# Patient Record
Sex: Male | Born: 1995 | Hispanic: Yes | Marital: Single | Smoking: Never smoker
Health system: Southern US, Community
[De-identification: ages and names within clinical notes are randomized; demographics above are authoritative.]

## PROBLEM LIST (undated history)

## (undated) DIAGNOSIS — M25312 Other instability, left shoulder: Secondary | ICD-10-CM

## (undated) HISTORY — PX: NO PAST SURGERIES: SHX2092

---

## 2016-06-16 ENCOUNTER — Ambulatory Visit (INDEPENDENT_AMBULATORY_CARE_PROVIDER_SITE_OTHER): Payer: Self-pay | Admitting: Family Medicine

## 2016-06-16 VITALS — BP 120/84 | HR 65 | Temp 98.6°F | Resp 18 | Ht 64.0 in | Wt 131.0 lb

## 2016-06-16 DIAGNOSIS — R059 Cough, unspecified: Secondary | ICD-10-CM

## 2016-06-16 DIAGNOSIS — J029 Acute pharyngitis, unspecified: Secondary | ICD-10-CM

## 2016-06-16 DIAGNOSIS — R05 Cough: Secondary | ICD-10-CM

## 2016-06-16 LAB — POCT RAPID STREP A (OFFICE): RAPID STREP A SCREEN: NEGATIVE

## 2016-06-16 MED ORDER — BENZONATATE 100 MG PO CAPS: 100.0000 mg | ORAL_CAPSULE | Freq: Three times a day (TID) | ORAL | 0 refills | Status: DC | PRN

## 2016-06-16 NOTE — Patient Instructions (Addendum)
Sore throat- Chloraseptic spray  Take Zyrtec 10 mg at bedtime daily until symptoms resolve.  Upper Respiratory Infection, Adult Most upper respiratory infections (URIs) are a viral infection of the air passages leading to the lungs. A URI affects the nose, throat, and upper air passages. The most common type of URI is nasopharyngitis and is typically referred to as "the common cold." URIs run their course and usually go away on their own. Most of the time, a URI does not require medical attention, but sometimes a bacterial infection in the upper airways can follow a viral infection. This is called a secondary infection. Sinus and middle ear infections are common types of secondary upper respiratory infections. Bacterial pneumonia can also complicate a URI. A URI can worsen asthma and chronic obstructive pulmonary disease (COPD). Sometimes, these complications can require emergency medical care and may be life threatening.  CAUSES Almost all URIs are caused by viruses. A virus is a type of germ and can spread from one person to another.  RISKS FACTORS You may be at risk for a URI if:   You smoke.   You have chronic heart or lung disease.  You have a weakened defense (immune) system.   You are very young or very old.   You have nasal allergies or asthma.  You work in crowded or poorly ventilated areas.  You work in health care facilities or schools. SIGNS AND SYMPTOMS  Symptoms typically develop 2-3 days after you come in contact with a cold virus. Most viral URIs last 7-10 days. However, viral URIs from the influenza virus (flu virus) can last 14-18 days and are typically more severe. Symptoms may include:   Runny or stuffy (congested) nose.   Sneezing.   Cough.   Sore throat.   Headache.   Fatigue.   Fever.   Loss of appetite.   Pain in your forehead, behind your eyes, and over your cheekbones (sinus pain).  Muscle aches.  DIAGNOSIS  Your health care  provider may diagnose a URI by:  Physical exam.  Tests to check that your symptoms are not due to another condition such as:  Strep throat.  Sinusitis.  Pneumonia.  Asthma. TREATMENT  A URI goes away on its own with time. It cannot be cured with medicines, but medicines may be prescribed or recommended to relieve symptoms. Medicines may help:  Reduce your fever.  Reduce your cough.  Relieve nasal congestion. HOME CARE INSTRUCTIONS   Take medicines only as directed by your health care provider.   Gargle warm saltwater or take cough drops to comfort your throat as directed by your health care provider.  Use a warm mist humidifier or inhale steam from a shower to increase air moisture. This may make it easier to breathe.  Drink enough fluid to keep your urine clear or pale yellow.   Eat soups and other clear broths and maintain good nutrition.   Rest as needed.   Return to work when your temperature has returned to normal or as your health care provider advises. You may need to stay home longer to avoid infecting others. You can also use a face mask and careful hand washing to prevent spread of the virus.  Increase the usage of your inhaler if you have asthma.   Do not use any tobacco products, including cigarettes, chewing tobacco, or electronic cigarettes. If you need help quitting, ask your health care provider. PREVENTION  The best way to protect yourself from getting a cold  is to practice good hygiene.   Avoid oral or hand contact with people with cold symptoms.   Wash your hands often if contact occurs.  There is no clear evidence that vitamin C, vitamin E, echinacea, or exercise reduces the chance of developing a cold. However, it is always recommended to get plenty of rest, exercise, and practice good nutrition.  SEEK MEDICAL CARE IF:   You are getting worse rather than better.   Your symptoms are not controlled by medicine.   You have chills.  You  have worsening shortness of breath.  You have brown or red mucus.  You have yellow or brown nasal discharge.  You have pain in your face, especially when you bend forward.  You have a fever.  You have swollen neck glands.  You have pain while swallowing.  You have white areas in the back of your throat. SEEK IMMEDIATE MEDICAL CARE IF:   You have severe or persistent:  Headache.  Ear pain.  Sinus pain.  Chest pain.  You have chronic lung disease and any of the following:  Wheezing.  Prolonged cough.  Coughing up blood.  A change in your usual mucus.  You have a stiff neck.  You have changes in your:  Vision.  Hearing.  Thinking.  Mood. MAKE SURE YOU:   Understand these instructions.  Will watch your condition.  Will get help right away if you are not doing well or get worse.   This information is not intended to replace advice given to you by your health care provider. Make sure you discuss any questions you have with your health care provider.   Document Released: 05/05/2001 Document Revised: 03/26/2015 Document Reviewed: 02/14/2014 Elsevier Interactive Patient Education 2016 [employer].     IF you received an x-ray today, you will receive an invoice from [facility]. Please contact [facility] at [phone] with questions or concerns regarding your invoice.   IF you received labwork today, you will receive an invoice from [employer]. Please contact [facility] at [phone] with questions or concerns regarding your invoice.   Our billing staff will not be able to assist you with questions regarding bills from these companies.  You will be contacted with the lab results as soon as they are available. The fastest way to get your results is to activate your My Chart account. Instructions are located on the last page of this paperwork. If you have not heard from [place] regarding the results in 2 weeks,  please contact this office.

## 2016-06-16 NOTE — Progress Notes (Signed)
   Patient ID: Evan Zimmerman, male    DOB: December 16, 1995, 20 y.o.   MRN: [id number]  PCP: No primary care provider on file.  Chief Complaint  Patient presents with  . Sore Throat    Since Saturday     Subjective:   HPI Presents for evaluation of sore throat times 4 days.  Patient reports developing a cough with burning in his throat 4 days ago. He reports feeling fatigued.  No body aches or known fever. He has developed a "mild headache" times one day. He has taken Ibuprofen with improved symptoms.  No recent contact with anyone who has been sick.  The patient attributes symptoms to starting a new job in very cold environment.      Review of Systems  Constitutional: Positive for fatigue. Negative for chills.  HENT: Positive for congestion, rhinorrhea and sore throat. Negative for ear discharge, ear pain and sinus pressure.   Eyes: Negative.   Respiratory: Negative.   Cardiovascular: Negative.        There are no active problems to display for this patient.    Prior to Admission medications   Not on File     Not on File     Objective:  Physical Exam  Constitutional: He appears well-developed and well-nourished.  HENT:  Head: Normocephalic and atraumatic.  Right Ear: External ear normal.  Left Ear: External ear normal.  Erythema bilateral nares. Dry nasal drainage. Throat has mild erythema present. Negative of exudate.   Eyes: Pupils are equal, round, and reactive to light.  Cardiovascular: Normal rate, regular rhythm, normal heart sounds and intact distal pulses.   Pulmonary/Chest: Effort normal and breath sounds normal.  Skin: Skin is warm and dry.           Assessment & Plan:  .1. Sore throat and Cough-Likely related to acute upper respiratory illness.   Patient has remained afebrile. Rapid Strep negative. -Treat symptomatically with over the counter chloraseptic spray . -Increase water intake.   2. Cough-Likely a symptom resulting from an acute  upper respiratory illness.  Benzonatate 100 mg, 3 times daily, as needed.   Follow-up as needed.  Godfrey Pick. Tiburcio Pea, MSN, FNP-C [facility]

## 2016-06-18 LAB — CULTURE, GROUP A STREP: Organism ID, Bacteria: NORMAL

## 2018-08-26 ENCOUNTER — Emergency Department (HOSPITAL_COMMUNITY)
Admission: EM | Admit: 2018-08-26 | Discharge: 2018-08-27 | Disposition: A | Discharge status: Home / Self Care | Payer: Self-pay | Attending: Emergency Medicine | Admitting: Emergency Medicine

## 2018-08-26 ENCOUNTER — Encounter (HOSPITAL_COMMUNITY): Payer: Self-pay | Admitting: Nurse Practitioner

## 2018-08-26 ENCOUNTER — Emergency Department (HOSPITAL_COMMUNITY): Payer: Self-pay

## 2018-08-26 DIAGNOSIS — Y92322 Soccer field as the place of occurrence of the external cause: Secondary | ICD-10-CM | POA: Insufficient documentation

## 2018-08-26 DIAGNOSIS — W1830XA Fall on same level, unspecified, initial encounter: Secondary | ICD-10-CM | POA: Insufficient documentation

## 2018-08-26 DIAGNOSIS — Y999 Unspecified external cause status: Secondary | ICD-10-CM | POA: Insufficient documentation

## 2018-08-26 DIAGNOSIS — Y9366 Activity, soccer: Secondary | ICD-10-CM | POA: Insufficient documentation

## 2018-08-26 DIAGNOSIS — S43015A Anterior dislocation of left humerus, initial encounter: Secondary | ICD-10-CM | POA: Insufficient documentation

## 2018-08-26 MED ORDER — PROPOFOL 10 MG/ML IV BOLUS
0.5000 mg/kg | Freq: Once | INTRAVENOUS | Status: AC
  Administered 2018-08-27: 75 mg via INTRAVENOUS
  Filled 2018-08-26: qty 20

## 2018-08-26 NOTE — ED Provider Notes (Signed)
[facility]-EMERGENCY DEPT Provider Note   CSN: [id number] Arrival date & time: 08/26/18  2159     History   Chief Complaint Chief Complaint  Patient presents with  . Shoulder Pain    Left    HPI Evan Zimmerman is a 22 y.o. male.  The history is provided by the patient.  He states that he was playing soccer and he fell and injured his left shoulder.  He states in the past that it is felt like it is popped out and popped back in, but this time it did not pop back in.  He denies other injury.  History reviewed. No pertinent past medical history.  There are no active problems to display for this patient.   History reviewed. No pertinent surgical history.      Home Medications    Prior to Admission medications   Medication Sig Start Date End Date Taking? Authorizing Provider  benzonatate (TESSALON) 100 MG capsule Take 1 capsule (100 mg total) by mouth 3 (three) times daily as needed for cough. 06/16/16   Bing Neighbors, FNP    Family History Family History  Problem Relation Age of Onset  . Heart disease Mother   . Hyperlipidemia Mother     Social History Social History   Tobacco Use  . Smoking status: Never Smoker  . Smokeless tobacco: Never Used  Substance Use Topics  . Alcohol use: Yes  . Drug use: Never     Allergies   Patient has no known allergies.   Review of Systems Review of Systems  All other systems reviewed and are negative.    Physical Exam Updated Vital Signs BP 119/72 (BP Location: Right Arm)   Pulse 76   Temp 98.5 F (36.9 C) (Oral)   Resp 16   Ht 5\' 4"  (1.626 m)   Wt 65.8 kg   SpO2 98%   BMI 24.89 kg/m   Physical Exam  Nursing note and vitals reviewed.  22 year old male, resting comfortably and in no acute distress. Vital signs are normal. Oxygen saturation is 98%, which is normal. Head is normocephalic and atraumatic. PERRLA, EOMI. Oropharynx is clear. Neck is nontender and supple without  adenopathy or JVD. Back is nontender and there is no CVA tenderness. Lungs are clear without rales, wheezes, or rhonchi. Chest is nontender. Heart has regular rate and rhythm without murmur. Abdomen is soft, flat, nontender without masses or hepatosplenomegaly and peristalsis is normoactive. Extremities: Deformity of the left shoulder noted consistent with anterior dislocation.  There is pain in any passive movement.  Distal neurovascular exam is intact with strong pulses, prompt capillary refill, normal sensation. Skin is warm and dry without rash. Neurologic: Mental status is normal, cranial nerves are intact, there are no motor or sensory deficits.  ED Treatments / Results   Radiology Dg Shoulder Left  Result Date: 08/26/2018 CLINICAL DATA:  Recent fall playing soccer, shoulder pain, history of dislocations EXAM: LEFT SHOULDER - 2+ VIEW COMPARISON:  None available FINDINGS: There is anterior inferior dislocation of the left glenohumeral joint. Limited exam because of positioning. Visualized thorax unremarkable. AC joint aligned. IMPRESSION: Anterior inferior left shoulder dislocation Electronically Signed   By: Judie Petit.  Shick M.D.   On: 08/26/2018 22:47   Dg Shoulder Left Port  Result Date: 08/27/2018 CLINICAL DATA:  Post reduction of left shoulder dislocation. EXAM: LEFT SHOULDER - 1 VIEW COMPARISON:  None. FINDINGS: The previous anterior dislocation has been reduced. IMPRESSION: The previous anterior dislocation  has been reduced. Electronically Signed   By: Gerome Sam III M.D   On: 08/27/2018 00:47    Procedures .Sedation Date/Time: 08/27/2018 12:10 AM Performed by: Dione Booze, MD Authorized by: Dione Booze, MD   Consent:    Consent obtained:  Verbal and written   Consent given by:  Patient   Risks discussed:  Allergic reaction, dysrhythmia, inadequate sedation, nausea, prolonged hypoxia resulting in organ damage, prolonged sedation necessitating reversal, respiratory compromise  necessitating ventilatory assistance and intubation and vomiting   Alternatives discussed:  Analgesia without sedation, anxiolysis and regional anesthesia Universal protocol:    Procedure explained and questions answered to patient or proxy's satisfaction: yes     Relevant documents present and verified: yes     Test results available and properly labeled: yes     Imaging studies available: yes     Required blood products, implants, devices, and special equipment available: yes     Site/side marked: yes     Immediately prior to procedure a time out was called: yes     Patient identity confirmation method:  Verbally with patient Indications:    Procedure necessitating sedation performed by:  Physician performing sedation Pre-sedation assessment:    Time since last food or drink:  6 hours   ASA classification: class 1 - normal, healthy patient     Neck mobility: normal     Mouth opening:  3 or more finger widths   Thyromental distance:  4 finger widths   Mallampati score:  I - soft palate, uvula, fauces, pillars visible   Pre-sedation assessments completed and reviewed: airway patency, cardiovascular function, hydration status, mental status, nausea/vomiting, pain level, respiratory function and temperature   Immediate pre-procedure details:    Reassessment: Patient reassessed immediately prior to procedure     Reviewed: vital signs, relevant labs/tests and NPO status     Verified: bag valve mask available, emergency equipment available, intubation equipment available, IV patency confirmed, oxygen available and suction available   Procedure details (see MAR for exact dosages):    Preoxygenation:  Nasal cannula   Sedation:  Propofol   Intra-procedure monitoring:  Blood pressure monitoring, cardiac monitor, continuous pulse oximetry, frequent LOC assessments, frequent vital sign checks and continuous capnometry   Intra-procedure events: none     Total Provider sedation time (minutes):   30 Post-procedure details:    Post-sedation assessment completed:  08/27/2018 12:40 AM   Attendance: Constant attendance by certified staff until patient recovered     Recovery: Patient returned to pre-procedure baseline     Post-sedation assessments completed and reviewed: airway patency, cardiovascular function, hydration status, mental status, nausea/vomiting, pain level, respiratory function and temperature     Patient is stable for discharge or admission: yes     Patient tolerance:  Tolerated well, no immediate complications Reduction of dislocation Date/Time: 08/27/2018 12:10 AM Performed by: Dione Booze, MD Authorized by: Dione Booze, MD  Consent: Written consent obtained. Risks and benefits: risks, benefits and alternatives were discussed Consent given by: patient Patient understanding: patient states understanding of the procedure being performed Patient consent: the patient's understanding of the procedure matches consent given Procedure consent: procedure consent matches procedure scheduled Relevant documents: relevant documents present and verified Test results: test results available and properly labeled Site marked: the operative site was marked Imaging studies: imaging studies available Required items: required blood products, implants, devices, and special equipment available Patient identity confirmed: verbally with patient and arm band Time out: Immediately prior to procedure a "time  out" was called to verify the correct patient, procedure, equipment, support staff and site/side marked as required. Local anesthesia used: no  Anesthesia: Local anesthesia used: no  Sedation: Patient sedated: yes Sedatives: propofol Sedation start date/time: 08/27/2018 12:10 AM Sedation end date/time: 08/27/2018 12:40 AM Vitals: Vital signs were monitored during sedation.  Patient tolerance: Patient tolerated the procedure well with no immediate complications Comments: Shoulder  dislocation reduced with traction/countertraction and manipulation. Post red cution x-ray ordered.      Medications Ordered in ED Medications  propofol (DIPRIVAN) 10 mg/mL bolus/IV push 32.9 mg (75 mg Intravenous Given by Other 08/27/18 0036)  propofol (DIPRIVAN) 10 mg/mL bolus/IV push (20 mg Intravenous Given 08/27/18 0019)     Initial Impression / Assessment and Plan / ED Course  I have reviewed the triage vital signs and the nursing notes.  Pertinent imaging results that were available during my care of the patient were reviewed by me and considered in my medical decision making (see chart for details).  Anterior dislocation of left shoulder.  X-ray confirms anterior dislocation.  Patient underwent procedural sedation with propofol, and dislocation reduced.  He was placed in a sling.  Old records are reviewed, and he has no relevant past visits.  He is referred to orthopedics for follow-up, discharged with prescription for tramadol.  Final Clinical Impressions(s) / ED Diagnoses   Final diagnoses:  Closed anterior dislocation of left shoulder, initial encounter    ED Discharge Orders         Ordered    traMADol (ULTRAM) 50 MG tablet  Every 6 hours PRN     08/27/18 0100           Dione Booze, MD 08/27/18 [phone]

## 2018-08-26 NOTE — ED Triage Notes (Signed)
Pt is c/o left shoulder pain that he states he suspect may have dislocated, states happens quite often. He adds that he fell on an out streathced arm.

## 2018-08-27 ENCOUNTER — Emergency Department (HOSPITAL_COMMUNITY): Payer: Self-pay

## 2018-08-27 MED ORDER — TRAMADOL HCL 50 MG PO TABS: 50.0000 mg | ORAL_TABLET | Freq: Four times a day (QID) | ORAL | 0 refills | Status: DC | PRN

## 2018-08-27 MED ORDER — PROPOFOL 10 MG/ML IV BOLUS
INTRAVENOUS | Status: AC | PRN
  Administered 2018-08-27: 20 mg via INTRAVENOUS
  Administered 2018-08-27: 32.9 mg via INTRAVENOUS
  Administered 2018-08-27: 20 mg via INTRAVENOUS

## 2018-08-27 NOTE — Discharge Instructions (Addendum)
Wear the sling until you see the orthopedic doctor.  Apply ice for 30 minutes 3-4 times a day. Take ibuprofen or acetaminophen as needed for less severe pain.

## 2019-04-16 ENCOUNTER — Emergency Department (HOSPITAL_COMMUNITY): Payer: Self-pay

## 2019-04-16 ENCOUNTER — Other Ambulatory Visit: Payer: Self-pay

## 2019-04-16 ENCOUNTER — Emergency Department (HOSPITAL_COMMUNITY)
Admission: EM | Admit: 2019-04-16 | Discharge: 2019-04-16 | Disposition: A | Discharge status: Home / Self Care | Payer: Self-pay | Attending: Emergency Medicine | Admitting: Emergency Medicine

## 2019-04-16 ENCOUNTER — Encounter (HOSPITAL_COMMUNITY): Payer: Self-pay

## 2019-04-16 DIAGNOSIS — S43005A Unspecified dislocation of left shoulder joint, initial encounter: Secondary | ICD-10-CM | POA: Insufficient documentation

## 2019-04-16 DIAGNOSIS — Y929 Unspecified place or not applicable: Secondary | ICD-10-CM | POA: Insufficient documentation

## 2019-04-16 DIAGNOSIS — Y999 Unspecified external cause status: Secondary | ICD-10-CM | POA: Insufficient documentation

## 2019-04-16 DIAGNOSIS — Y939 Activity, unspecified: Secondary | ICD-10-CM | POA: Insufficient documentation

## 2019-04-16 DIAGNOSIS — X58XXXA Exposure to other specified factors, initial encounter: Secondary | ICD-10-CM | POA: Insufficient documentation

## 2019-04-16 MED ORDER — PROPOFOL 10 MG/ML IV BOLUS
INTRAVENOUS | Status: AC | PRN
  Administered 2019-04-16 (×2): 20 mg via INTRAVENOUS
  Administered 2019-04-16: 60 mg via INTRAVENOUS
  Administered 2019-04-16: 20 mg via INTRAVENOUS

## 2019-04-16 MED ORDER — PROPOFOL 10 MG/ML IV BOLUS
0.5000 mg/kg | Freq: Once | INTRAVENOUS | Status: DC
  Filled 2019-04-16: qty 20

## 2019-04-16 NOTE — ED Notes (Signed)
Portable X-Ray at bedside.

## 2019-04-16 NOTE — ED Notes (Signed)
Pt easily aroused, alert and oriented x 4, pt aware we are continuing to monitor status. Pt has not complaints

## 2019-04-16 NOTE — ED Notes (Signed)
Good pulse and cap refills in left ext, due to ? Left shoulder Dislocation arm held at slight angle.

## 2019-04-16 NOTE — Sedation Documentation (Signed)
He tolerates sedation procedure quite well. He maintained his airway with normal depth/rate of respirations throughout. He is now awake and comfortable. He thanks [place] for our care.

## 2019-04-16 NOTE — ED Triage Notes (Signed)
Pt states that he was playing with his dog when he injured his left shoulder. Hx of dislocation.

## 2019-04-16 NOTE — Discharge Instructions (Signed)
It was my pleasure taking care of you today!   Wear sling for the next 2-3 days.   Tylenol or Ibuprofen as needed for pain.   Call Orthopedic doctor on Tuesday to schedule follow up appointment.   Return to ER for new or worsening symptoms, any additional concerns.

## 2019-04-16 NOTE — ED Provider Notes (Signed)
.  Sedation Date/Time: 04/16/2019 1:00 PM Performed by: Tilden Fossa, MD Authorized by: Tilden Fossa, MD   Consent:    Consent obtained:  Verbal   Consent given by:  Evan Zimmerman   Risks discussed:  Allergic reaction, dysrhythmia, inadequate sedation, nausea, prolonged hypoxia resulting in organ damage, prolonged sedation necessitating reversal, respiratory compromise necessitating ventilatory assistance and intubation and vomiting   Alternatives discussed:  Analgesia without sedation, anxiolysis and regional anesthesia Universal protocol:    Procedure explained and questions answered to Evan Zimmerman or proxy's satisfaction: yes     Relevant documents present and verified: yes     Test results available and properly labeled: yes     Imaging studies available: yes     Required blood products, implants, devices, and special equipment available: yes     Site/side marked: yes     Immediately prior to procedure a time out was called: yes     Evan Zimmerman identity confirmation method:  Verbally with Evan Zimmerman Indications:    Procedure necessitating sedation performed by:  Physician performing sedation Pre-sedation assessment:    Time since last food or drink:  8   ASA classification: class 1 - normal, healthy Evan Zimmerman     Neck mobility: normal     Mouth opening:  3 or more finger widths   Thyromental distance:  4 finger widths   Mallampati score:  I - soft palate, uvula, fauces, pillars visible   Pre-sedation assessments completed and reviewed: airway patency, cardiovascular function, hydration status, mental status, nausea/vomiting, pain level, respiratory function and temperature   Immediate pre-procedure details:    Reassessment: Evan Zimmerman reassessed immediately prior to procedure     Reviewed: vital signs, relevant labs/tests and NPO status     Verified: bag valve mask available, emergency equipment available, intubation equipment available, IV patency confirmed, oxygen available and suction available    Procedure details (see MAR for exact dosages):    Preoxygenation:  Nasal cannula   Sedation:  Propofol   Intra-procedure monitoring:  Blood pressure monitoring, cardiac monitor, continuous pulse oximetry, frequent LOC assessments, frequent vital sign checks and continuous capnometry   Intra-procedure events: none     Total Provider sedation time (minutes):  11 Post-procedure details:    Post-sedation assessment completed:  04/16/2019 1:17 PM   Attendance: Constant attendance by certified staff until Evan Zimmerman recovered     Recovery: Evan Zimmerman returned to pre-procedure baseline     Post-sedation assessments completed and reviewed: airway patency, cardiovascular function, hydration status, mental status, nausea/vomiting, pain level, respiratory function and temperature     Evan Zimmerman is stable for discharge or admission: yes     Evan Zimmerman tolerance:  Tolerated well, no immediate complications      Tilden Fossa, MD 04/16/19 [phone]

## 2019-04-16 NOTE — ED Provider Notes (Signed)
Evan Zimmerman   CSN: [id number] Arrival date & time: 04/16/19  1144    History   Chief Complaint Chief Complaint  Patient presents with  . Shoulder Injury    HPI Evan Zimmerman is a 23 y.o. male.     The history is provided by the patient and medical records. No language interpreter was used.  Shoulder Injury    Evan Zimmerman is a 23 y.o. male who presents to the Emergency Department complaining of acute onset of left shoulder pain which began just prior to arrival.  Patient states that he was leaning over playing with his dog when his left shoulder suddenly popped out of place.  History of anterior shoulder dislocation in the past which felt similar.  No numbness.  No medications prior to arrival for pain.   History reviewed. No pertinent past medical history.  There are no active problems to display for this patient.   History reviewed. No pertinent surgical history.      Home Medications    Prior to Admission medications   Medication Sig Start Date End Date Taking? Authorizing Provider  traMADol (ULTRAM) 50 MG tablet Take 1 tablet (50 mg total) by mouth every 6 (six) hours as needed. 08/27/18   Dione Booze, MD    Family History Family History  Problem Relation Age of Onset  . Heart disease Mother   . Hyperlipidemia Mother     Social History Social History   Tobacco Use  . Smoking status: Never Smoker  . Smokeless tobacco: Never Used  Substance Use Topics  . Alcohol use: Yes  . Drug use: Never     Allergies   Patient has no known allergies.   Review of Systems Review of Systems  Musculoskeletal: Positive for arthralgias and myalgias.  All other systems reviewed and are negative.    Physical Exam Updated Vital Signs BP 131/66   Pulse 61   Temp 98.4 F (36.9 C) (Oral)   Resp 18   Wt 66 kg   SpO2 99%   BMI 24.98 kg/m   Physical Exam Vitals signs and nursing Zimmerman reviewed.   Constitutional:      General: He is not in acute distress.    Appearance: He is well-developed.  HENT:     Head: Normocephalic and atraumatic.  Neck:     Musculoskeletal: Neck supple.  Cardiovascular:     Rate and Rhythm: Normal rate and regular rhythm.     Heart sounds: Normal heart sounds. No murmur.  Pulmonary:     Effort: Pulmonary effort is normal. No respiratory distress.     Breath sounds: Normal breath sounds.  Musculoskeletal:     Comments: Palpable step off to the left shoulder concerning for dislocation. Decreased ROM at shoulder. Strong grip strength. 2+ radial pulse.   Skin:    General: Skin is warm and dry.  Neurological:     Mental Status: He is alert and oriented to person, place, and time.     Comments: Sensation intact to left upper extremity in axillary, median, ulnar and radial nerve distributions.      ED Treatments / Results  Labs (all labs ordered are listed, but only abnormal results are displayed) Labs Reviewed - No data to display  EKG None  Radiology Dg Shoulder Left  Result Date: 04/16/2019 CLINICAL DATA:  Post reduction for anterior shoulder dislocation EXAM: LEFT SHOULDER - 2+ VIEW COMPARISON:  Apr 16, 2019 study obtained earlier in the day  FINDINGS: Oblique and Y scapular images were obtained. There has been successful reduction of anterior dislocation. Currently no fracture or dislocation. Joint spaces appear normal. No erosive change. Visualized right lung clear. IMPRESSION: Successful reduction of anterior shoulder dislocation. Currently no fracture or dislocation. No evident arthropathy. Electronically Signed   By: Bretta BangWilliam  Woodruff III M.D.   On: 04/16/2019 13:33   Dg Shoulder Left  Result Date: 04/16/2019 CLINICAL DATA:  Left shoulder pain following injury playing with dog. History of dislocation. EXAM: LEFT SHOULDER - 2+ VIEW COMPARISON:  Radiographs 08/27/2018 and 08/26/2018. FINDINGS: AP and Y-views. Patient unable to position for the  axillary view. Recurrent anterior inferior dislocation of the glenohumeral joint without definite acute fracture. No significant arthropathic changes. 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 Postreduction views recommended. Electronically Signed   By: Carey BullocksWilliam  Veazey M.D.   On: 04/16/2019 12:26    Procedures Reduction of dislocation Date/Time: 04/16/2019 1:54 PM Performed by: Dyanara Cozza, Chase PicketJaime Pilcher, PA-C Authorized by: Celina Shiley, Chase PicketJaime Pilcher, PA-C  Consent: Verbal consent obtained. Written consent obtained. Risks and benefits: risks, benefits and alternatives were discussed Consent given by: patient Patient understanding: patient states understanding of the procedure being performed Patient consent: the patient's understanding of the procedure matches consent given Procedure consent: procedure consent matches procedure scheduled Relevant documents: relevant documents present and verified Imaging studies: imaging studies available Patient identity confirmed: verbally with patient and arm band Time out: Immediately prior to procedure a "time out" was called to verify the correct patient, procedure, equipment, support staff and site/side marked as required.  Sedation: Patient sedated: yes Sedatives: propofol Vitals: Vital signs were monitored during sedation.  Patient tolerance: Patient tolerated the procedure well with no immediate complications    (including critical care time)  Medications Ordered in ED Medications  propofol (DIPRIVAN) 10 mg/mL bolus/IV push 33 mg (0 mg Intravenous Hold 04/16/19 1238)  propofol (DIPRIVAN) 10 mg/mL bolus/IV push (20 mg Intravenous Given 04/16/19 1307)     Initial Impression / Assessment and Plan / ED Course  I have reviewed the triage vital signs and the nursing notes.  Pertinent labs & imaging results that were available during my care of the patient were reviewed by me and considered in my medical decision  making (see chart for details).       Evan Zimmerman is a 23 y.o. male who presents to ED for acute left shoulder pain. Left shoulder dislocation on exam. Confirmed on X-ray. NVI. Reduced in ED as dictated above without complication. Sedation performed by attending, Dr. Madilyn Hookees. NVI after reduction as well. Placed in sling. Ortho follow up provided. Return precautions discussed and all questions answered.   Patient seen by and discussed with Dr. Madilyn Hookees who agrees with treatment plan.   Final Clinical Impressions(s) / ED Diagnoses   Final diagnoses:  Dislocation of left shoulder joint, initial encounter    ED Discharge Orders    None       Mosie Angus, Chase PicketJaime Pilcher, PA-C 04/16/19 1406    Tilden Fossaees, Elizabeth, MD 04/17/19 [phone]

## 2019-04-16 NOTE — ED Notes (Signed)
Pt ambulated without difficulty

## 2019-08-21 IMAGING — CR DG SHOULDER 2+V*L*
2 series · 2 of 2 positions shown · non-contrast
Comparison: None available

CLINICAL DATA: Recent fall playing soccer, shoulder pain, history
of dislocations

EXAM:
LEFT SHOULDER - 2+ VIEW

[w shoulder external left]
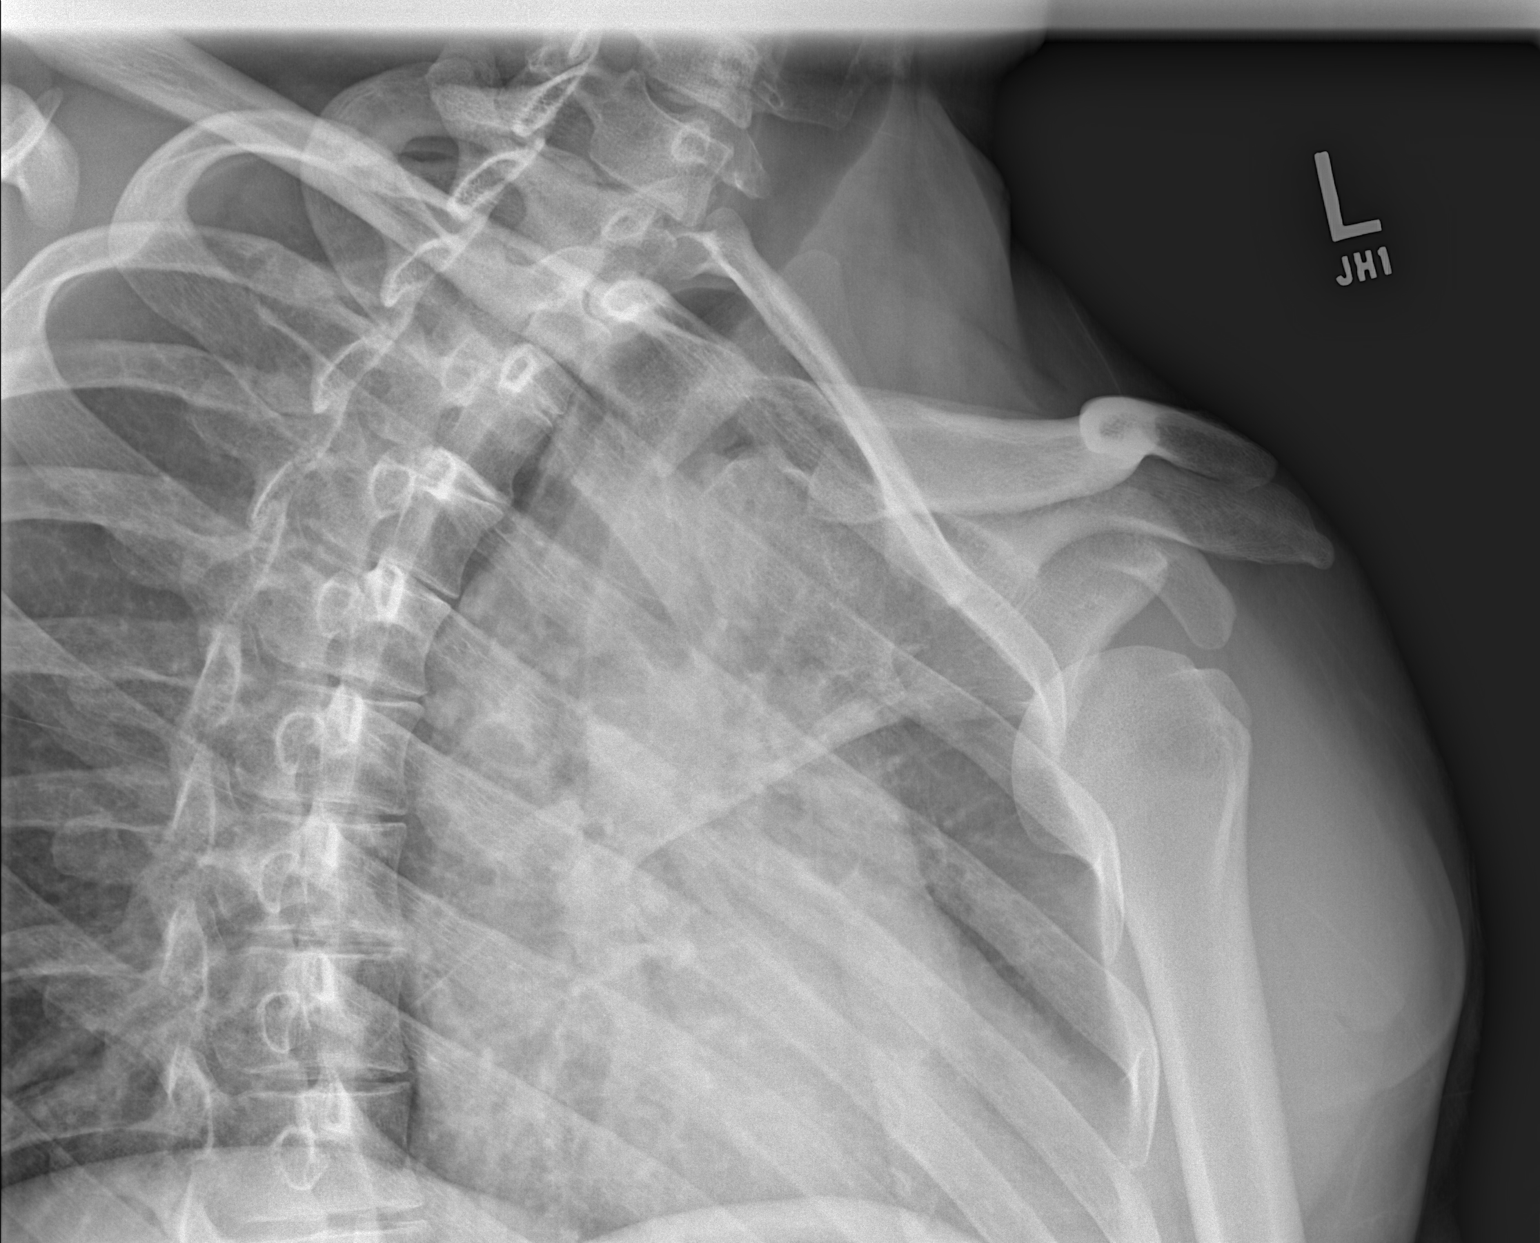

[w shoulder y-view left]
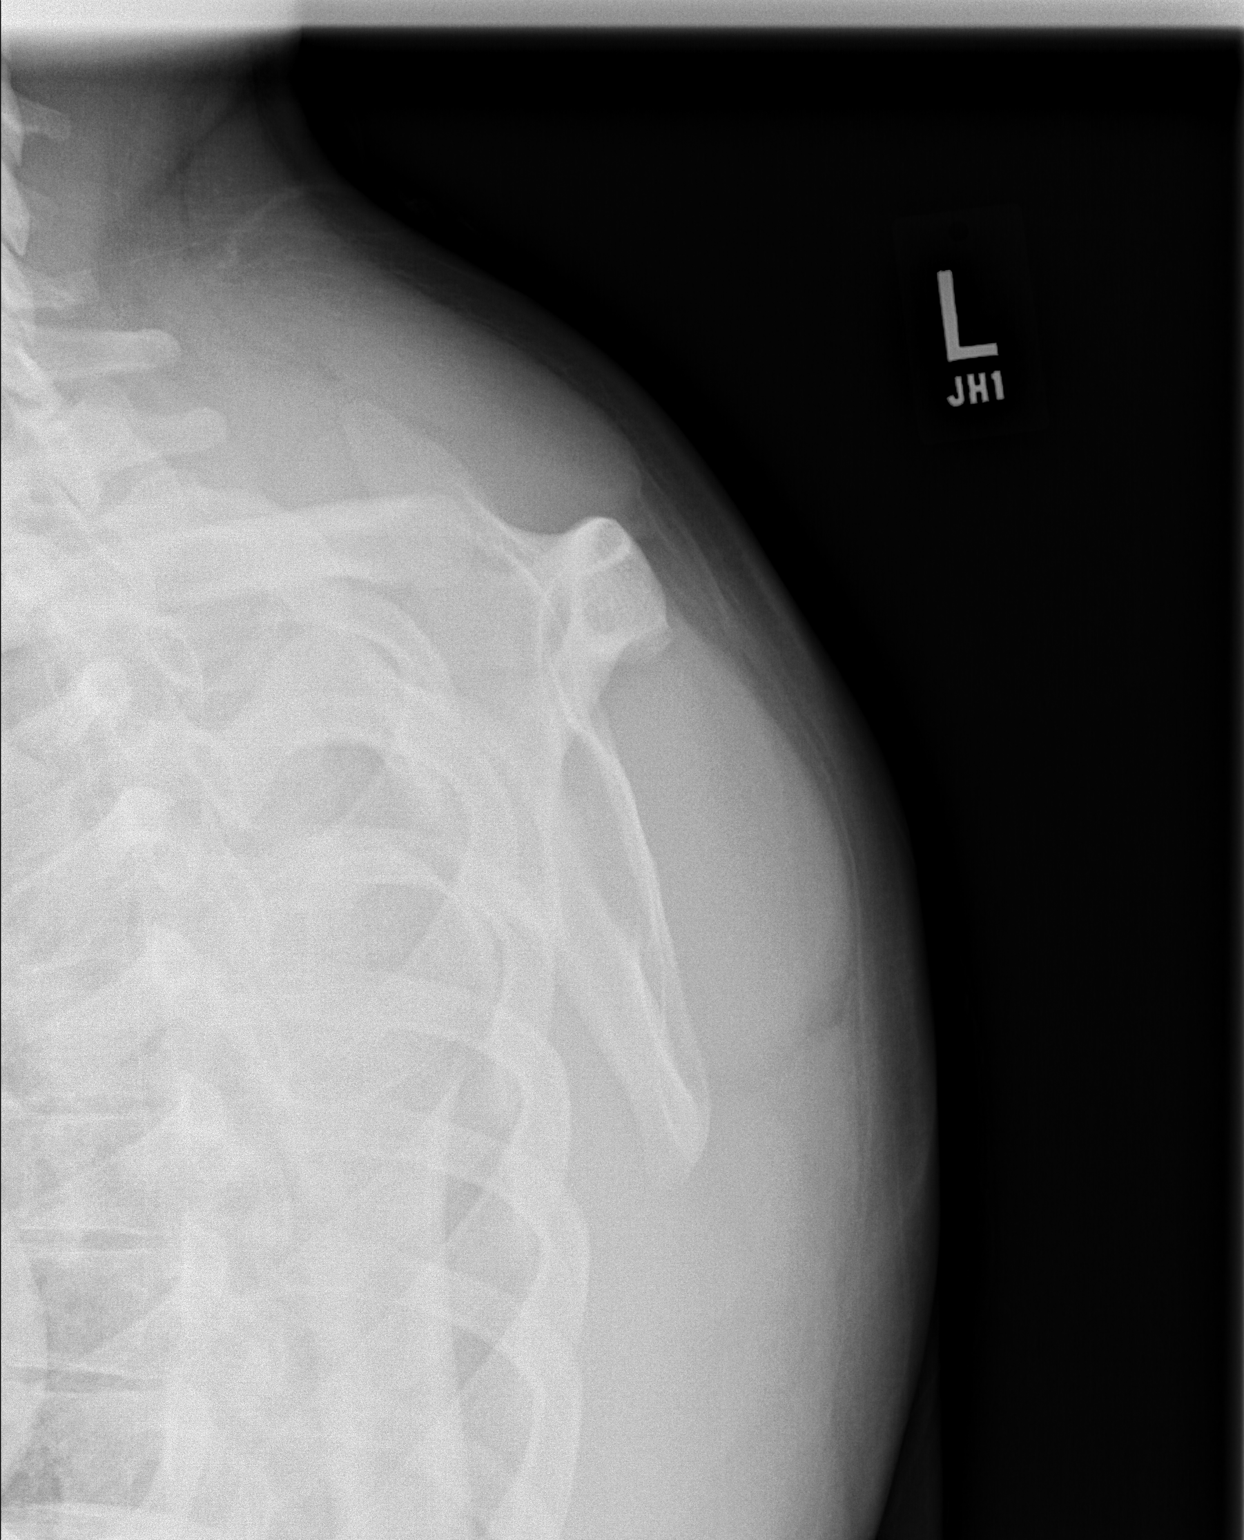

[2 of 2 positions shown; findings below may reference images not displayed]

FINDINGS: There is anterior inferior dislocation of the left glenohumeral
joint. Limited exam because of positioning. Visualized thorax
unremarkable. AC joint aligned.
IMPRESSION: Anterior inferior left shoulder dislocation

## 2019-08-22 IMAGING — DX DG SHOULDER 1V*L*
2 series · 2 of 2 positions shown · non-contrast
Comparison: None.

CLINICAL DATA: Post reduction of left shoulder dislocation.

EXAM:
LEFT SHOULDER - 1 VIEW

[shoulder ap]
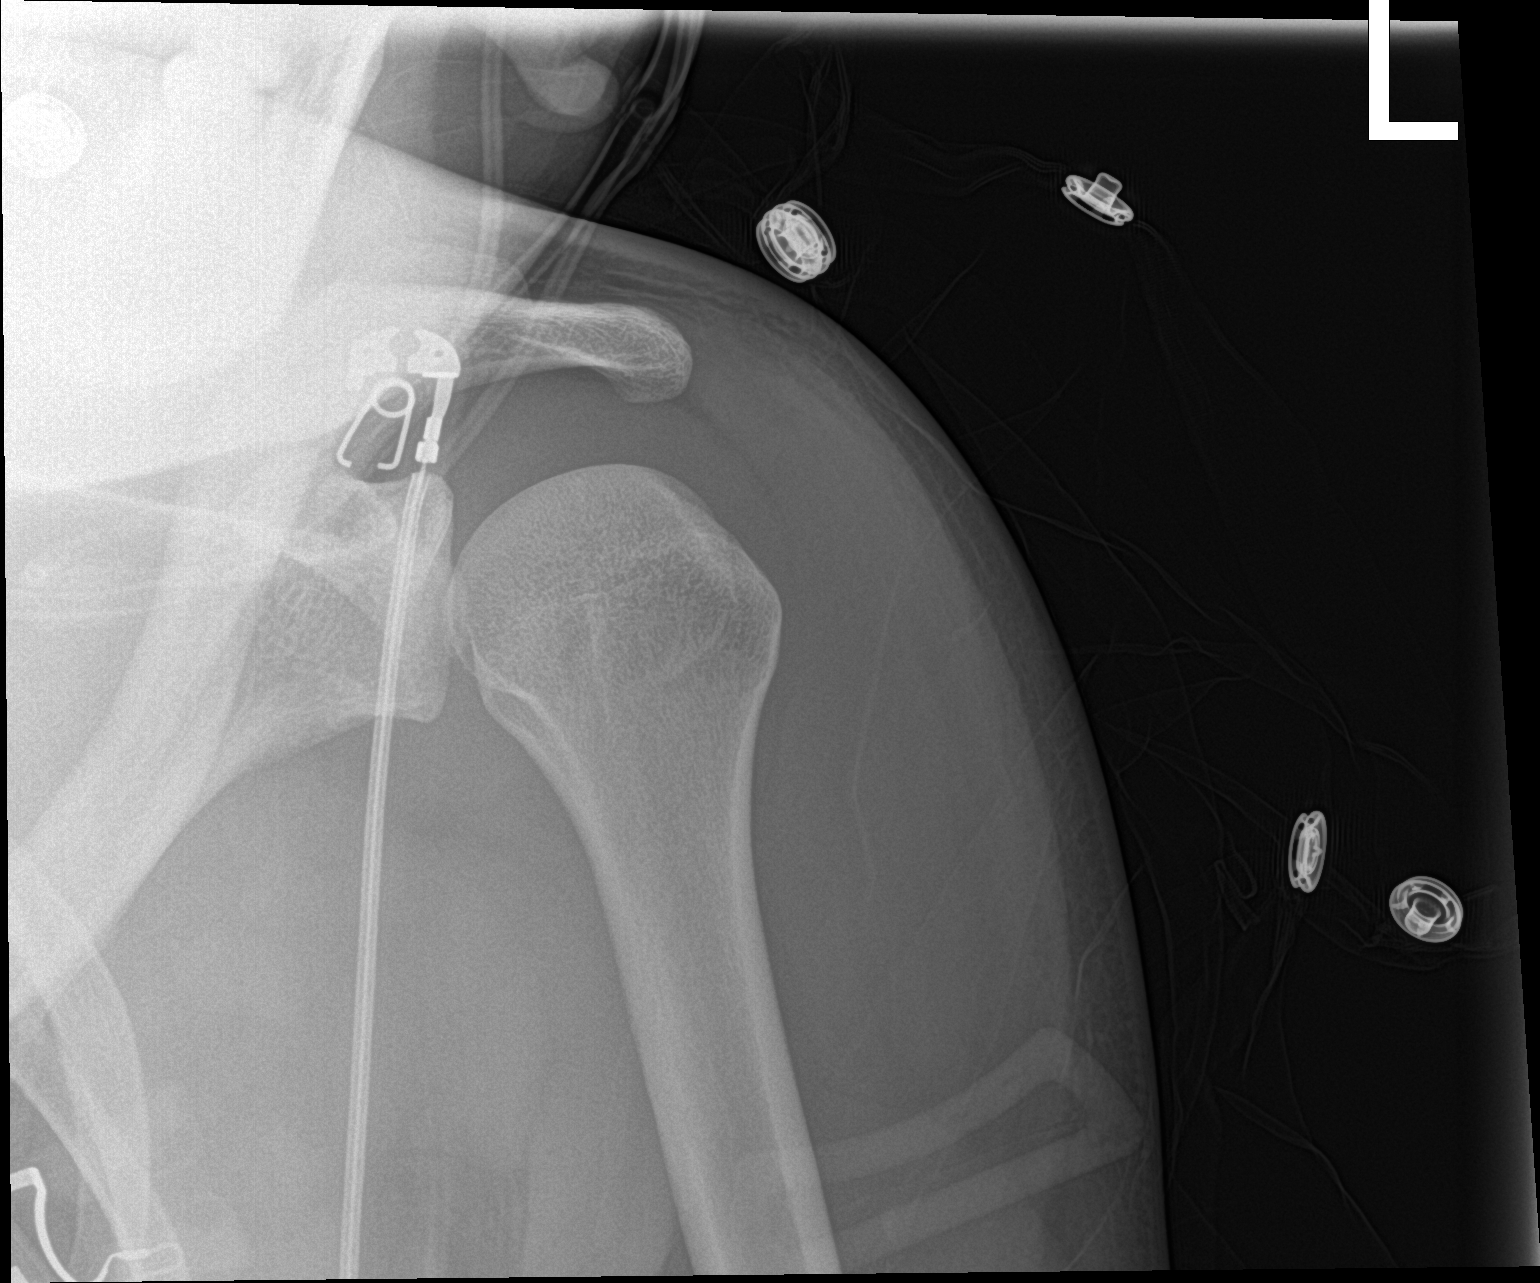

[shoulder obl]
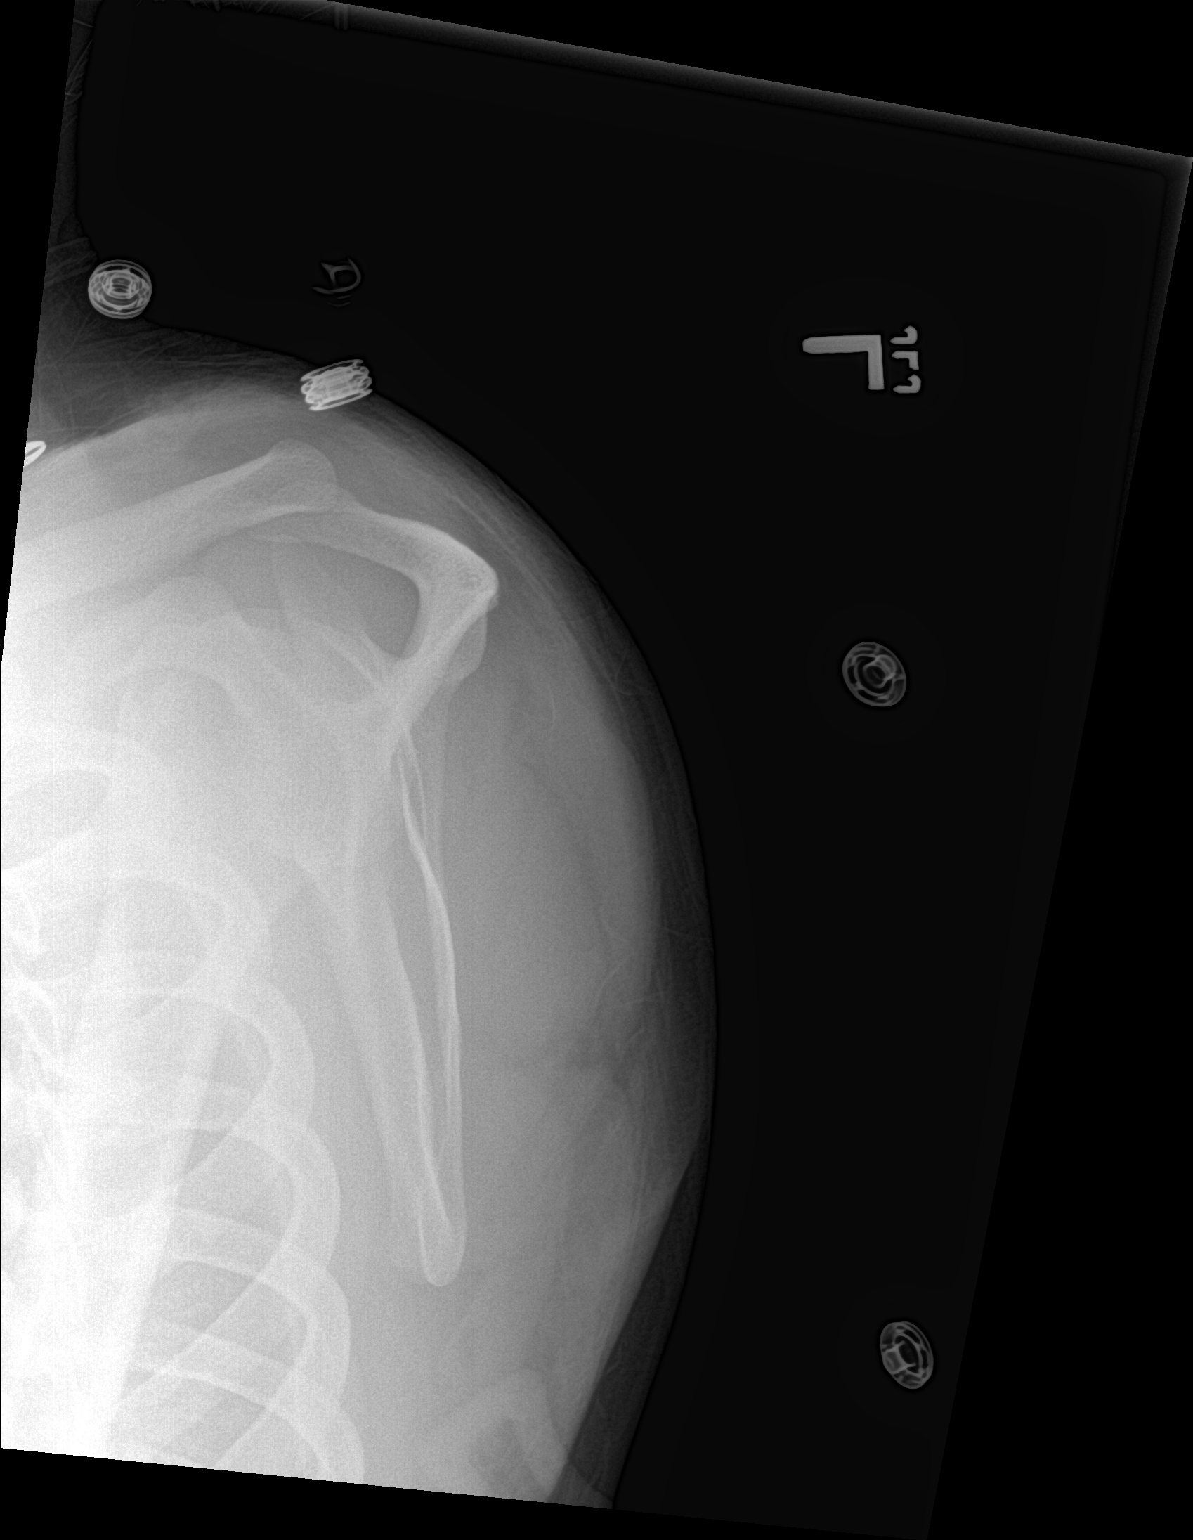

[2 of 2 positions shown; findings below may reference images not displayed]

FINDINGS: The previous anterior dislocation has been reduced.
IMPRESSION: The previous anterior dislocation has been reduced.

## 2020-01-16 ENCOUNTER — Emergency Department (HOSPITAL_COMMUNITY): Payer: BC Managed Care – PPO

## 2020-01-16 ENCOUNTER — Other Ambulatory Visit: Payer: Self-pay

## 2020-01-16 ENCOUNTER — Emergency Department (HOSPITAL_COMMUNITY)
Admission: EM | Admit: 2020-01-16 | Discharge: 2020-01-16 | Disposition: A | Discharge status: Home / Self Care | Payer: BC Managed Care – PPO | Attending: Emergency Medicine | Admitting: Emergency Medicine

## 2020-01-16 ENCOUNTER — Encounter (HOSPITAL_COMMUNITY): Payer: Self-pay | Admitting: Family Medicine

## 2020-01-16 DIAGNOSIS — S4992XA Unspecified injury of left shoulder and upper arm, initial encounter: Secondary | ICD-10-CM | POA: Diagnosis present

## 2020-01-16 DIAGNOSIS — S43015A Anterior dislocation of left humerus, initial encounter: Secondary | ICD-10-CM | POA: Diagnosis not present

## 2020-01-16 DIAGNOSIS — Y929 Unspecified place or not applicable: Secondary | ICD-10-CM | POA: Diagnosis not present

## 2020-01-16 DIAGNOSIS — X500XXA Overexertion from strenuous movement or load, initial encounter: Secondary | ICD-10-CM | POA: Insufficient documentation

## 2020-01-16 DIAGNOSIS — Y999 Unspecified external cause status: Secondary | ICD-10-CM | POA: Insufficient documentation

## 2020-01-16 DIAGNOSIS — Y9389 Activity, other specified: Secondary | ICD-10-CM | POA: Diagnosis not present

## 2020-01-16 LAB — I-STAT CHEM 8, ED
BUN: 14 mg/dL (ref 6–20)
Calcium, Ion: 1.21 mmol/L (ref 1.15–1.40)
Chloride: 104 mmol/L (ref 98–111)
Creatinine, Ser: 0.9 mg/dL (ref 0.61–1.24)
Glucose, Bld: 98 mg/dL (ref 70–99)
HCT: 44 % (ref 39.0–52.0)
Hemoglobin: 15 g/dL (ref 13.0–17.0)
Potassium: 4.1 mmol/L (ref 3.5–5.1)
Sodium: 140 mmol/L (ref 135–145)
TCO2: 25 mmol/L (ref 22–32)

## 2020-01-16 MED ORDER — PROPOFOL 10 MG/ML IV BOLUS
0.5000 mg/kg | Freq: Once | INTRAVENOUS | Status: AC
  Administered 2020-01-16: 10:00:00 35.9 mg via INTRAVENOUS
  Filled 2020-01-16: qty 20

## 2020-01-16 MED ORDER — IBUPROFEN 600 MG PO TABS: 600.0000 mg | ORAL_TABLET | Freq: Four times a day (QID) | ORAL | 0 refills | Status: DC | PRN

## 2020-01-16 MED ORDER — PROPOFOL 10 MG/ML IV BOLUS
INTRAVENOUS | Status: AC | PRN
  Administered 2020-01-16 (×3): 20 mg via INTRAVENOUS

## 2020-01-16 MED ORDER — LIDOCAINE HCL 2 % IJ SOLN
10.0000 mL | Freq: Once | INTRAMUSCULAR | Status: AC
  Administered 2020-01-16: 08:00:00 200 mg
  Filled 2020-01-16: qty 20

## 2020-01-16 MED ORDER — MIDAZOLAM HCL 2 MG/2ML IJ SOLN
2.0000 mg | Freq: Once | INTRAMUSCULAR | Status: AC
  Administered 2020-01-16: 09:00:00 2 mg via INTRAVENOUS
  Filled 2020-01-16: qty 2

## 2020-01-16 MED ORDER — FENTANYL CITRATE (PF) 100 MCG/2ML IJ SOLN
50.0000 ug | Freq: Once | INTRAMUSCULAR | Status: AC
  Administered 2020-01-16: 08:00:00 50 ug via INTRAVENOUS
  Filled 2020-01-16: qty 2

## 2020-01-16 MED ORDER — METHOCARBAMOL 500 MG PO TABS: 500.0000 mg | ORAL_TABLET | Freq: Two times a day (BID) | ORAL | 0 refills | Status: DC

## 2020-01-16 MED ORDER — KETOROLAC TROMETHAMINE 30 MG/ML IJ SOLN
15.0000 mg | Freq: Once | INTRAMUSCULAR | Status: AC
  Administered 2020-01-16: 15 mg via INTRAVENOUS
  Filled 2020-01-16: qty 1

## 2020-01-16 MED ORDER — FENTANYL CITRATE (PF) 100 MCG/2ML IJ SOLN
50.0000 ug | Freq: Once | INTRAMUSCULAR | Status: AC
  Administered 2020-01-16: 50 ug via INTRAVENOUS

## 2020-01-16 NOTE — ED Notes (Signed)
Attempted to call patients fiance to update on plan of care. No answer.

## 2020-01-16 NOTE — ED Notes (Signed)
Shawn, PA attempted shoulder relocation at bedside.  Patient tolerating well.

## 2020-01-16 NOTE — ED Notes (Signed)
Pt is awake, alert, oriented x3. Skin w//d/pink. Resp wnl, equal and non-labored. Denies pain or any other s/sx. Sipping ginger ale without distress or difficulty. Awaiting ride. Cont to monitor.

## 2020-01-16 NOTE — ED Notes (Signed)
Informed consent signed by patient and chris, RN for left shoulder reduction.

## 2020-01-16 NOTE — Discharge Instructions (Signed)
You have been seen today for a shoulder dislocation.  This was reduced in the emergency department. Antiinflammatory medications: Take 600 mg of ibuprofen every 6 hours or 440 mg (over the counter dose) to 500 mg (prescription dose) of naproxen every 12 hours for the next 3 days. After this time, these medications may be used as needed for pain. Take these medications with food to avoid upset stomach. Choose only one of these medications, do not take them together. Acetaminophen (generic for Tylenol): Should you continue to have additional pain while taking the ibuprofen or naproxen, you may add in acetaminophen as needed. Your daily total maximum amount of acetaminophen from all sources should be limited to 4000mg /day for persons without liver problems, or 2000mg /day for those with liver problems. Methocarbamol: Methocarbamol (generic for Robaxin) is a muscle relaxer and can help relieve stiff muscles or muscle spasms.  Do not drive or perform other dangerous activities while taking this medication as it can cause drowsiness as well as changes in reaction time and judgement. Ice: May apply ice to the area over the next 24 hours for 15 minutes at a time to reduce swelling. Elevation: Keep the extremity elevated as often as possible to reduce pain and inflammation. Support: Wear the sling for support and comfort. Wear this until pain resolves.  Follow up: Recommend follow-up with the orthopedic specialist within the next 2 weeks.  Call to make an appointment. Return: Return to the ED for numbness, weakness, increasing pain, overall worsening symptoms, loss of function, or if symptoms are not improving, you have tried to follow up with the orthopedic specialist, and have been unable to do so.  For prescription assistance, may try using prescription discount sites or apps, such as goodrx.com

## 2020-01-16 NOTE — ED Notes (Signed)
Left shoulder unable to be reduced after 2 rounds of Fentanyl IV. +CSM. Skin w/d/pink. Cap refill less than 2 seconds. +pulse noted.

## 2020-01-16 NOTE — ED Triage Notes (Signed)
Patient reports his left shoulder is dislocated from reaching back to care for his child. He states this has happen multiple times and he has been able relocate the shoulder.

## 2020-01-16 NOTE — ED Notes (Signed)
X-ray at bedside

## 2020-01-16 NOTE — ED Provider Notes (Signed)
[facility]-EMERGENCY DEPT Provider Note   CSN: [id number] Arrival date & time: 01/16/20  5809     History Chief Complaint  Patient presents with  . Shoulder Dislocation    Cuong Caniglia is a 24 y.o. male.  HPI      Criston Spanos is a 24 y.o. male, with a history of left shoulder dislocation, presenting to the ED with left shoulder pain with suspected dislocation that occurred about an hour prior to arrival.  Patient states he has had multiple left shoulder dislocations in the past.  He was holding a small child in the left arm, the child shifted, and patient felt his shoulder dislocate. Pain is aching, 8/10, nonradiating.  Denies numbness, weakness, neck pain, falls/trauma, or any other complaints.     History reviewed. No pertinent past medical history.  There are no problems to display for this patient.   History reviewed. No pertinent surgical history.     Family History  Problem Relation Age of Onset  . Heart disease Mother   . Hyperlipidemia Mother     Social History   Tobacco Use  . Smoking status: Never Smoker  . Smokeless tobacco: Never Used  Substance Use Topics  . Alcohol use: Yes  . Drug use: Never    Home Medications Prior to Admission medications   Medication Sig Start Date End Date Taking? Authorizing Provider  ibuprofen (ADVIL) 600 MG tablet Take 1 tablet (600 mg total) by mouth every 6 (six) hours as needed. 01/16/20   Aracelis Ulrey C, PA-C  methocarbamol (ROBAXIN) 500 MG tablet Take 1 tablet (500 mg total) by mouth 2 (two) times daily. 01/16/20   Guillermo Difrancesco C, PA-C  traMADol (ULTRAM) 50 MG tablet Take 1 tablet (50 mg total) by mouth every 6 (six) hours as needed. 08/27/18   Dione Booze, MD    Allergies    Patient has no known allergies.  Review of Systems   Review of Systems  Musculoskeletal: Positive for arthralgias.  Neurological: Negative for weakness and numbness.    Physical Exam Updated Vital Signs BP  119/85   Pulse 70   Temp 98.3 F (36.8 C) (Oral)   Resp 15   Ht 5\' 4"  (1.626 m)   Wt 71.7 kg   SpO2 98%   BMI 27.12 kg/m   Physical Exam Vitals and nursing note reviewed.  Constitutional:      General: He is not in acute distress.    Appearance: He is well-developed. He is not diaphoretic.  HENT:     Head: Normocephalic and atraumatic.  Eyes:     Conjunctiva/sclera: Conjunctivae normal.  Cardiovascular:     Rate and Rhythm: Normal rate and regular rhythm.     Pulses:          Radial pulses are 2+ on the right side and 2+ on the left side.  Pulmonary:     Effort: Pulmonary effort is normal.  Musculoskeletal:     Cervical back: Normal range of motion and neck supple. No tenderness.     Comments: Some tenderness and dislocation deformity to left shoulder. Full range of motion without pain in the left elbow and wrist.  Skin:    General: Skin is warm and dry.     Capillary Refill: Capillary refill takes less than 2 seconds.     Coloration: Skin is not pale.  Neurological:     Mental Status: He is alert.     Comments: Sensation grossly intact to light  touch through each of the nerve distributions of the bilateral upper extremities. Abduction and adduction of the fingers intact against resistance. Grip strength equal bilaterally. Supination and pronation intact against resistance. Strength 5/5 through the cardinal directions of the bilateral wrists. Strength 5/5 with flexion and extension of the bilateral elbows. Patient can touch the thumb to each one of the fingertips without difficulty.  Patient can hold the "OK" sign against resistance.  Psychiatric:        Behavior: Behavior normal.     ED Results / Procedures / Treatments   Labs (all labs ordered are listed, but only abnormal results are displayed) Labs Reviewed  I-STAT CHEM 8, ED    EKG None  Radiology DG Shoulder Left  Result Date: 01/16/2020 CLINICAL DATA:  History of shoulder dislocations with apparent  dislocation after exaggerated motion EXAM: LEFT SHOULDER - 2+ VIEW COMPARISON:  Apr 16, 2019 FINDINGS: Frontal, oblique, and Y scapular images obtained. There is a subcoracoid anterior dislocation. No evident acute fracture. Apparent Hill-Sachs defect along the lateral humeral head. No appreciable arthropathy. Left lung clear. IMPRESSION: Subcoracoid anterior dislocation. Apparent Hill-Sachs defect along the lateral humeral head. No acute fracture or evident arthropathy. Electronically Signed   By: Lowella Grip III M.D.   On: 01/16/2020 08:38   DG Shoulder Left Portable  Result Date: 01/16/2020 CLINICAL DATA:  Reduction, left shoulder dislocation. EXAM: LEFT SHOULDER COMPARISON:  Left shoulder evaluation of 01/16/2020 FINDINGS: Left shoulder is located, Hill-Sachs deformity again suspected. Soft tissues are unremarkable. IMPRESSION: Successful reduction of left shoulder dislocation again with suspected Hill-Sachs defect. Electronically Signed   By: Zetta Bills M.D.   On: 01/16/2020 10:19    Procedures Reduction of dislocation  Date/Time: 01/16/2020 9:41 AM Performed by: Lorayne Bender, PA-C Authorized by: Lorayne Bender, PA-C  Consent: Verbal consent obtained. Written consent obtained. Risks and benefits: risks, benefits and alternatives were discussed Consent given by: patient Patient understanding: patient states understanding of the procedure being performed Patient consent: the patient's understanding of the procedure matches consent given Procedure consent: procedure consent matches procedure scheduled Required items: required blood products, implants, devices, and special equipment available Patient identity confirmed: verbally with patient and provided demographic data Time out: Immediately prior to procedure a "time out" was called to verify the correct patient, procedure, equipment, support staff and site/side marked as required. Local anesthesia used: yes Anesthesia method:  Intraarticular.  Anesthesia: Local anesthesia used: yes Local Anesthetic: lidocaine 2% without epinephrine Anesthetic total: 5 mL  Sedation: Patient sedated: yes Sedation type: moderate (conscious) sedation Sedatives: propofol Analgesia: fentanyl Sedation start date/time: 01/16/2020 9:37 AM Sedation end date/time: 01/16/2020 9:47 AM Vitals: Vital signs were monitored during sedation.  Patient tolerance: patient tolerated the procedure well with no immediate complications  Injection of joint  Date/Time: 01/16/2020 8:40 AM Performed by: Lorayne Bender, PA-C Authorized by: Lorayne Bender, PA-C  Consent: Verbal consent obtained. Risks and benefits: risks, benefits and alternatives were discussed Consent given by: patient Patient understanding: patient states understanding of the procedure being performed Patient identity confirmed: verbally with patient and provided demographic data Local anesthesia used: yes Anesthesia method: Intra-articular.  Anesthesia: Local anesthesia used: yes Local Anesthetic: lidocaine 2% without epinephrine Anesthetic total: 5 mL  Sedation: Patient sedated: no  Patient tolerance: patient tolerated the procedure well with no immediate complications    (including critical care time)  Medications Ordered in ED Medications  fentaNYL (SUBLIMAZE) injection 50 mcg (50 mcg Intravenous Given 01/16/20 0758)  lidocaine (  XYLOCAINE) 2 % (with pres) injection 200 mg (200 mg Other Given 01/16/20 0759)  fentaNYL (SUBLIMAZE) injection 50 mcg (50 mcg Intravenous Given 01/16/20 0824)  propofol (DIPRIVAN) 10 mg/mL bolus/IV push 35.9 mg (35.9 mg Intravenous Given 01/16/20 0938)  midazolam (VERSED) injection 2 mg (2 mg Intravenous Given 01/16/20 0909)  propofol (DIPRIVAN) 10 mg/mL bolus/IV push (20 mg Intravenous Given 01/16/20 0942)  ketorolac (TORADOL) 30 MG/ML injection 15 mg (15 mg Intravenous Given 01/16/20 1007)    ED Course  I have reviewed the triage vital signs and  the nursing notes.  Pertinent labs & imaging results that were available during my care of the patient were reviewed by me and considered in my medical decision making (see chart for details).    MDM Rules/Calculators/A&P                      Patient presents with left shoulder pain.  Shoulder found to be dislocated.  No evidence of neurovascular compromise. Initially, reduction was attempted with IV analgesics as well as intra-articular injection of lidocaine.  This was unsuccessful. We had to sedate the patient to obtain successful reduction. Patient placed in a sling.  Orthopedic follow-up recommended. The patient was given instructions for home care as well as return precautions. Patient voices understanding of these instructions, accepts the plan, and is comfortable with discharge.  I reviewed and interpreted the patient's labs and radiological studies.  Findings and plan of care discussed with Arby Barrette, MD. Dr. Donnald Garre personally evaluated and examined this patient.   Final Clinical Impression(s) / ED Diagnoses Final diagnoses:  Anterior dislocation of left shoulder, initial encounter    Rx / DC Orders ED Discharge Orders         Ordered    methocarbamol (ROBAXIN) 500 MG tablet  2 times daily     01/16/20 1014    ibuprofen (ADVIL) 600 MG tablet  Every 6 hours PRN     01/16/20 1014           Anselm Pancoast, PA-C 01/16/20 1317    Arby Barrette, MD 01/19/20 1329

## 2020-01-16 NOTE — Progress Notes (Signed)
C02 range during shoulder dislocation repair was 37-42.

## 2020-01-16 NOTE — ED Notes (Signed)
Spoke to patient fiance Morrie Sheldon and updated on plan of care.

## 2020-01-16 NOTE — ED Notes (Addendum)
Left shoulder sling applied by ortho tech. +CSM. +pulses. Cap refill less than 2 seconds. Ice applied.

## 2020-01-16 NOTE — ED Notes (Signed)
Ice applied to left shoulder

## 2020-01-16 NOTE — ED Notes (Signed)
Informed consent signed for left shoulder reduction conscious sedation.   Shawn, Pa at bedside.   Cardiac monitor, suction, Ambu bag, and CO2 monitor set up at bedside.   Code cart outside door.

## 2020-01-16 NOTE — ED Provider Notes (Signed)
Physical Exam  BP 119/85   Pulse 70   Temp 98.3 F (36.8 C) (Oral)   Resp 15   Ht 5\' 4"  (1.626 m)   Wt 71.7 kg   SpO2 98%   BMI 27.12 kg/m   Physical Exam  ED Course/Procedures     .Sedation  Date/Time: 01/16/2020 10:01 AM Performed by: 01/18/2020, MD Authorized by: Arby Barrette, MD   Consent:    Consent obtained:  Verbal   Consent given by:  Patient   Risks discussed:  Allergic reaction, dysrhythmia, inadequate sedation, nausea, prolonged hypoxia resulting in organ damage, prolonged sedation necessitating reversal, respiratory compromise necessitating ventilatory assistance and intubation and vomiting   Alternatives discussed:  Analgesia without sedation, anxiolysis and regional anesthesia Universal protocol:    Procedure explained and questions answered to patient or proxy's satisfaction: yes     Relevant documents present and verified: yes     Test results available and properly labeled: yes     Imaging studies available: yes     Required blood products, implants, devices, and special equipment available: yes     Site/side marked: yes     Immediately prior to procedure a time out was called: yes     Patient identity confirmation method:  Verbally with patient Indications:    Procedure necessitating sedation performed by:  Physician performing sedation Pre-sedation assessment:    Time since last food or drink:  9pm yesterday   ASA classification: class 1 - normal, healthy patient     Neck mobility: normal     Mouth opening:  3 or more finger widths   Thyromental distance:  4 finger widths   Mallampati score:  I - soft palate, uvula, fauces, pillars visible   Pre-sedation assessments completed and reviewed: airway patency, cardiovascular function, hydration status, mental status, nausea/vomiting, pain level, respiratory function and temperature     Pre-sedation assessment completed:  01/16/2020 9:00 AM Immediate pre-procedure details:    Reassessment: Patient  reassessed immediately prior to procedure     Reviewed: vital signs, relevant labs/tests and NPO status     Verified: bag valve mask available, emergency equipment available, intubation equipment available, IV patency confirmed, oxygen available and suction available   Procedure details (see MAR for exact dosages):    Preoxygenation:  Nasal cannula   Sedation:  Propofol   Intended level of sedation: deep   Intra-procedure monitoring:  Blood pressure monitoring, cardiac monitor, continuous pulse oximetry, frequent LOC assessments, frequent vital sign checks and continuous capnometry   Intra-procedure events: none     Total Provider sedation time (minutes):  20 Post-procedure details:    Attendance: Constant attendance by certified staff until patient recovered     Recovery: Patient returned to pre-procedure baseline     Post-sedation assessments completed and reviewed: airway patency, cardiovascular function, hydration status, mental status, nausea/vomiting, pain level, respiratory function and temperature     Patient is stable for discharge or admission: yes     Patient tolerance:  Tolerated well, no immediate complications Comments:     Patient tolerated without difficulty.    MDM  Patient presents with history of recurrent shoulder dislocation.  This happened spontaneously while lifting his child.  Patient is otherwise healthy.  PA-C Joy initially attempted reduction with local joint anesthesia and manipulation.  He was unable to reduce with this method.  We did proceed then to propofol sedation.  Patient tolerated this well.  I have reviewed post reduction x-rays.  Shoulder was reduced.  Patient  is in good condition.       Charlesetta Shanks, MD 01/16/20 1005

## 2020-01-16 NOTE — ED Notes (Signed)
Respiratory and ortho tech paged for sedation.   Pharmacy called to verify medication.

## 2020-01-16 NOTE — ED Notes (Signed)
Attempted to call ortho tech X4. Glass blower/designer has paged ortho. No response.

## 2020-01-16 NOTE — ED Notes (Signed)
Re-eval by Harolyn Rutherford PA. Pt responds to voice. Skin w/d/pink. +CSM. Left shoulder sling intact. Skin integrity maint. Cap refill less than 2 sec. Cont to monitor.

## 2020-02-26 ENCOUNTER — Encounter (HOSPITAL_BASED_OUTPATIENT_CLINIC_OR_DEPARTMENT_OTHER): Payer: Self-pay | Admitting: Orthopedic Surgery

## 2020-02-28 ENCOUNTER — Other Ambulatory Visit: Payer: Self-pay

## 2020-02-28 ENCOUNTER — Encounter (HOSPITAL_BASED_OUTPATIENT_CLINIC_OR_DEPARTMENT_OTHER): Payer: Self-pay | Admitting: Orthopedic Surgery

## 2020-02-28 NOTE — Progress Notes (Signed)
Spoke w/ via phone for pre-op interview--- PT Lab needs dos----no              Lab results------ no COVID test ------ 03-01-2020 @ 1520 Arrive at ------- 1000 NPO after ------ MN w/ exception clear liquids until 0900 then nothing by mouth (no cream/ milk products) Medications to take morning of surgery ----- NONE Diabetic medication ----- n/a Patient Special Instructions ----- n/a Pre-Op special Istructions ----- n/a Patient verbalized understanding of instructions that were given at this phone interview. Patient denies shortness of breath, chest pain, fever, cough a this phone interview.

## 2020-03-01 ENCOUNTER — Other Ambulatory Visit (HOSPITAL_COMMUNITY)
Admission: RE | Admit: 2020-03-01 | Discharge: 2020-03-01 | Disposition: A | Discharge status: Home / Self Care | Payer: BC Managed Care – PPO | Source: Ambulatory Visit | Attending: Orthopedic Surgery | Admitting: Orthopedic Surgery

## 2020-03-01 DIAGNOSIS — Z20822 Contact with and (suspected) exposure to covid-19: Secondary | ICD-10-CM | POA: Diagnosis not present

## 2020-03-01 DIAGNOSIS — Z01812 Encounter for preprocedural laboratory examination: Secondary | ICD-10-CM | POA: Insufficient documentation

## 2020-03-02 LAB — SARS CORONAVIRUS 2 (TAT 6-24 HRS): SARS Coronavirus 2: NEGATIVE

## 2020-03-05 ENCOUNTER — Encounter (HOSPITAL_BASED_OUTPATIENT_CLINIC_OR_DEPARTMENT_OTHER)
Admission: RE | Disposition: A | Discharge status: Home / Self Care | Payer: Self-pay | Source: Home / Self Care | Attending: Orthopedic Surgery

## 2020-03-05 ENCOUNTER — Ambulatory Visit (HOSPITAL_BASED_OUTPATIENT_CLINIC_OR_DEPARTMENT_OTHER)
Admission: RE | Admit: 2020-03-05 | Discharge: 2020-03-05 | Disposition: A | Discharge status: Home / Self Care | Payer: BC Managed Care – PPO | Attending: Orthopedic Surgery | Admitting: Orthopedic Surgery

## 2020-03-05 ENCOUNTER — Other Ambulatory Visit: Payer: Self-pay

## 2020-03-05 ENCOUNTER — Encounter (HOSPITAL_BASED_OUTPATIENT_CLINIC_OR_DEPARTMENT_OTHER): Payer: Self-pay | Admitting: Orthopedic Surgery

## 2020-03-05 ENCOUNTER — Ambulatory Visit (HOSPITAL_BASED_OUTPATIENT_CLINIC_OR_DEPARTMENT_OTHER): Payer: BC Managed Care – PPO | Admitting: Anesthesiology

## 2020-03-05 DIAGNOSIS — M25312 Other instability, left shoulder: Secondary | ICD-10-CM | POA: Diagnosis not present

## 2020-03-05 DIAGNOSIS — S42292A Other displaced fracture of upper end of left humerus, initial encounter for closed fracture: Secondary | ICD-10-CM | POA: Diagnosis not present

## 2020-03-05 DIAGNOSIS — Z20822 Contact with and (suspected) exposure to covid-19: Secondary | ICD-10-CM | POA: Diagnosis not present

## 2020-03-05 DIAGNOSIS — S43432A Superior glenoid labrum lesion of left shoulder, initial encounter: Secondary | ICD-10-CM | POA: Insufficient documentation

## 2020-03-05 DIAGNOSIS — X58XXXA Exposure to other specified factors, initial encounter: Secondary | ICD-10-CM | POA: Diagnosis not present

## 2020-03-05 HISTORY — PX: BANKART REPAIR: SHX5173

## 2020-03-05 HISTORY — DX: Other instability, left shoulder: M25.312

## 2020-03-05 SURGERY — REPAIR, SHOULDER, ARTHROSCOPIC, BANKART
Anesthesia: General | Site: Hip | Laterality: Left

## 2020-03-05 MED ORDER — CEFAZOLIN SODIUM-DEXTROSE 2-4 GM/100ML-% IV SOLN
INTRAVENOUS | Status: AC
  Filled 2020-03-05: qty 100

## 2020-03-05 MED ORDER — PROPOFOL 10 MG/ML IV BOLUS
INTRAVENOUS | Status: AC
  Filled 2020-03-05: qty 20

## 2020-03-05 MED ORDER — PROPOFOL 10 MG/ML IV BOLUS
INTRAVENOUS | Status: DC | PRN
  Administered 2020-03-05: 120 mg via INTRAVENOUS

## 2020-03-05 MED ORDER — ROCURONIUM BROMIDE 50 MG/5ML IV SOSY
PREFILLED_SYRINGE | INTRAVENOUS | Status: DC | PRN
  Administered 2020-03-05: 75 mg via INTRAVENOUS

## 2020-03-05 MED ORDER — HYDROMORPHONE HCL 1 MG/ML IJ SOLN
0.2500 mg | INTRAMUSCULAR | Status: DC | PRN
  Filled 2020-03-05: qty 0.5

## 2020-03-05 MED ORDER — ROCURONIUM BROMIDE 10 MG/ML (PF) SYRINGE
PREFILLED_SYRINGE | INTRAVENOUS | Status: AC
  Filled 2020-03-05: qty 10

## 2020-03-05 MED ORDER — CEFAZOLIN SODIUM-DEXTROSE 2-4 GM/100ML-% IV SOLN
2.0000 g | INTRAVENOUS | Status: DC
  Filled 2020-03-05: qty 100

## 2020-03-05 MED ORDER — OXYCODONE HCL 5 MG PO TABS
5.0000 mg | ORAL_TABLET | Freq: Once | ORAL | Status: AC
  Administered 2020-03-05: 5 mg via ORAL
  Filled 2020-03-05: qty 1

## 2020-03-05 MED ORDER — FENTANYL CITRATE (PF) 100 MCG/2ML IJ SOLN
INTRAMUSCULAR | Status: AC
  Filled 2020-03-05: qty 2

## 2020-03-05 MED ORDER — OXYCODONE HCL 5 MG PO TABS: 5.0000 mg | ORAL_TABLET | ORAL | 0 refills | Status: AC | PRN

## 2020-03-05 MED ORDER — FENTANYL CITRATE (PF) 100 MCG/2ML IJ SOLN
INTRAMUSCULAR | Status: DC | PRN
  Administered 2020-03-05 (×2): 50 ug via INTRAVENOUS

## 2020-03-05 MED ORDER — MEPERIDINE HCL 25 MG/ML IJ SOLN
6.2500 mg | INTRAMUSCULAR | Status: DC | PRN
  Filled 2020-03-05: qty 1

## 2020-03-05 MED ORDER — MIDAZOLAM HCL 2 MG/2ML IJ SOLN
INTRAMUSCULAR | Status: AC
  Filled 2020-03-05: qty 2

## 2020-03-05 MED ORDER — LACTATED RINGERS IV SOLN
INTRAVENOUS | Status: DC
  Filled 2020-03-05 (×2): qty 1000

## 2020-03-05 MED ORDER — LIDOCAINE 2% (20 MG/ML) 5 ML SYRINGE
INTRAMUSCULAR | Status: DC | PRN
  Administered 2020-03-05: 100 mg via INTRAVENOUS

## 2020-03-05 MED ORDER — SODIUM CHLORIDE 0.9 % IR SOLN
Status: DC | PRN
  Administered 2020-03-05: 13:00:00 3000 mL

## 2020-03-05 MED ORDER — SODIUM CHLORIDE 0.9 % IR SOLN
Status: DC | PRN
  Administered 2020-03-05: 6000 mL

## 2020-03-05 MED ORDER — ONDANSETRON HCL 4 MG/2ML IJ SOLN
INTRAMUSCULAR | Status: DC | PRN
  Administered 2020-03-05: 4 mg via INTRAVENOUS

## 2020-03-05 MED ORDER — BUPIVACAINE LIPOSOME 1.3 % IJ SUSP
INTRAMUSCULAR | Status: DC | PRN
  Administered 2020-03-05: 10 mL via PERINEURAL

## 2020-03-05 MED ORDER — SUGAMMADEX SODIUM 200 MG/2ML IV SOLN
INTRAVENOUS | Status: DC | PRN
  Administered 2020-03-05: 140 mg via INTRAVENOUS

## 2020-03-05 MED ORDER — ONDANSETRON 4 MG PO TBDP: 4.0000 mg | ORAL_TABLET | Freq: Three times a day (TID) | ORAL | 0 refills | Status: DC | PRN

## 2020-03-05 MED ORDER — DEXAMETHASONE SODIUM PHOSPHATE 10 MG/ML IJ SOLN
INTRAMUSCULAR | Status: AC
  Filled 2020-03-05: qty 1

## 2020-03-05 MED ORDER — OXYCODONE HCL 5 MG PO TABS
ORAL_TABLET | ORAL | Status: AC
  Filled 2020-03-05: qty 1

## 2020-03-05 MED ORDER — ONDANSETRON HCL 4 MG/2ML IJ SOLN
4.0000 mg | Freq: Once | INTRAMUSCULAR | Status: DC | PRN
  Filled 2020-03-05: qty 2

## 2020-03-05 MED ORDER — LIDOCAINE 2% (20 MG/ML) 5 ML SYRINGE
INTRAMUSCULAR | Status: AC
  Filled 2020-03-05: qty 5

## 2020-03-05 MED ORDER — DEXAMETHASONE SODIUM PHOSPHATE 10 MG/ML IJ SOLN
INTRAMUSCULAR | Status: DC | PRN
  Administered 2020-03-05: 10 mg via INTRAVENOUS

## 2020-03-05 MED ORDER — BUPIVACAINE-EPINEPHRINE (PF) 0.5% -1:200000 IJ SOLN
INTRAMUSCULAR | Status: DC | PRN
  Administered 2020-03-05: 20 mL via PERINEURAL

## 2020-03-05 MED ORDER — MIDAZOLAM HCL 2 MG/2ML IJ SOLN
2.0000 mg | Freq: Once | INTRAMUSCULAR | Status: AC
  Administered 2020-03-05: 2 mg via INTRAVENOUS
  Filled 2020-03-05: qty 2

## 2020-03-05 MED ORDER — ONDANSETRON HCL 4 MG/2ML IJ SOLN
INTRAMUSCULAR | Status: AC
  Filled 2020-03-05: qty 2

## 2020-03-05 MED ORDER — FENTANYL CITRATE (PF) 100 MCG/2ML IJ SOLN
100.0000 ug | Freq: Once | INTRAMUSCULAR | Status: AC
  Administered 2020-03-05: 100 ug via INTRAVENOUS
  Filled 2020-03-05: qty 2

## 2020-03-05 SURGICAL SUPPLY — 79 items
ANCHOR SUT 1.8 FBRTK KNTLS 2SU (Anchor) ×12 IMPLANT
BLADE SURG 11 STRL SS (BLADE) ×3 IMPLANT
BURR OVAL 8 FLU 4.0MM X 13CM (MISCELLANEOUS) ×1
BURR OVAL 8 FLU 4.0X13 (MISCELLANEOUS) ×2 IMPLANT
CANNULA 5.75X7 CRYSTAL CLEAR (CANNULA) ×3 IMPLANT
CANNULA 5.75X71 LONG (CANNULA) ×4 IMPLANT
CANNULA TWIST IN 8.25X7CM (CANNULA) ×4 IMPLANT
CONNECTOR 5 IN 1 STRAIGHT STRL (MISCELLANEOUS) ×3 IMPLANT
COOLER ICEMAN CLASSIC (MISCELLANEOUS) ×2 IMPLANT
DISSECTOR  3.8MM X 13CM (MISCELLANEOUS) ×2
DISSECTOR 3.8MM X 13CM (MISCELLANEOUS) ×1 IMPLANT
DRAPE ORTHO SPLIT 77X108 STRL (DRAPES) ×4
DRAPE POUCH INSTRU U-SHP 10X18 (DRAPES) ×3 IMPLANT
DRAPE SHEET LG 3/4 BI-LAMINATE (DRAPES) ×3 IMPLANT
DRAPE STERI 35X30 U-POUCH (DRAPES) ×3 IMPLANT
DRAPE SURG 17X23 STRL (DRAPES) ×3 IMPLANT
DRAPE SURG ORHT 6 SPLT 77X108 (DRAPES) ×2 IMPLANT
DRAPE U-SHAPE 47X51 STRL (DRAPES) ×3 IMPLANT
DRSG EMULSION OIL 3X3 NADH (GAUZE/BANDAGES/DRESSINGS) ×3 IMPLANT
DRSG PAD ABDOMINAL 8X10 ST (GAUZE/BANDAGES/DRESSINGS) ×3 IMPLANT
DURAPREP 26ML APPLICATOR (WOUND CARE) ×3 IMPLANT
FIBERSTICK 2 (SUTURE) IMPLANT
GAUZE SPONGE 4X4 12PLY STRL (GAUZE/BANDAGES/DRESSINGS) ×3 IMPLANT
GLOVE BIO SURGEON STRL SZ7.5 (GLOVE) ×3 IMPLANT
GLOVE BIOGEL PI IND STRL 8 (GLOVE) ×1 IMPLANT
GLOVE BIOGEL PI INDICATOR 8 (GLOVE) ×2
GOWN STRL REUS W/TWL LRG LVL3 (GOWN DISPOSABLE) ×3 IMPLANT
IV NS IRRIG 3000ML ARTHROMATIC (IV SOLUTION) ×6 IMPLANT
KIT STR SPEAR 1.8 FBRTK DISP (KITS) ×2 IMPLANT
KIT TURNOVER CYSTO (KITS) ×3 IMPLANT
LASSO 90 CVE QUICKPAS (DISPOSABLE) ×2 IMPLANT
LASSO CRESCENT QUICKPASS (SUTURE) ×2 IMPLANT
LASSO SUT 90 DEGREE (SUTURE) IMPLANT
LOOP 2 FIBERLINK CLOSED (SUTURE) IMPLANT
MANIFOLD NEPTUNE II (INSTRUMENTS) ×3 IMPLANT
NDL FILTER BLUNT 18X1 1/2 (NEEDLE) ×1 IMPLANT
NDL MAYO 6 CRC TAPER PT (NEEDLE) IMPLANT
NDL MAYO CATGUT SZ4 TPR NDL (NEEDLE) IMPLANT
NDL SAFETY ECLIPSE 18X1.5 (NEEDLE) IMPLANT
NDL SCORPION MULTI FIRE (NEEDLE) IMPLANT
NEEDLE FILTER BLUNT 18X 1/2SAF (NEEDLE) ×2
NEEDLE FILTER BLUNT 18X1 1/2 (NEEDLE) ×1 IMPLANT
NEEDLE HYPO 18GX1.5 SHARP (NEEDLE)
NEEDLE MAYO 6 CRC TAPER PT (NEEDLE) IMPLANT
NEEDLE MAYO CATGUT SZ4 (NEEDLE) IMPLANT
NEEDLE SCORPION MULTI FIRE (NEEDLE) IMPLANT
NS IRRIG 500ML POUR BTL (IV SOLUTION) ×3 IMPLANT
PACK ARTHROSCOPY DSU (CUSTOM PROCEDURE TRAY) ×3 IMPLANT
PACK BASIN DAY SURGERY FS (CUSTOM PROCEDURE TRAY) ×3 IMPLANT
PAD COLD SHLDR WRAP-ON (PAD) ×2 IMPLANT
PROBE APOLLO 90XL (SURGICAL WAND) ×3 IMPLANT
SLEEVE ARM SUSPENSION SYSTEM (MISCELLANEOUS) ×3 IMPLANT
SLING S3 LATERAL DISP (MISCELLANEOUS) ×3 IMPLANT
SLING ULTRA II AB L (ORTHOPEDIC SUPPLIES) IMPLANT
SLING ULTRA II L (ORTHOPEDIC SUPPLIES) ×2 IMPLANT
SPONGE LAP 4X18 RFD (DISPOSABLE) IMPLANT
SUCTION FRAZIER HANDLE 10FR (MISCELLANEOUS)
SUCTION TUBE FRAZIER 10FR DISP (MISCELLANEOUS) IMPLANT
SUT 2 FIBERLOOP 20 STRT BLUE (SUTURE)
SUT ETHILON 3 0 PS 1 (SUTURE) IMPLANT
SUT FIBERWIRE #2 38 T-5 BLUE (SUTURE)
SUT LASSO 45 DEGREE LEFT (SUTURE) IMPLANT
SUT LASSO 45D RIGHT (SUTURE) IMPLANT
SUT MNCRL AB 3-0 PS2 27 (SUTURE) ×3 IMPLANT
SUT PDS AB 0 CT1 36 (SUTURE) IMPLANT
SUT TIGER TAPE 7 IN WHITE (SUTURE) IMPLANT
SUT VIC AB 0 CT1 36 (SUTURE) IMPLANT
SUT VIC AB 2-0 CT1 27 (SUTURE)
SUT VIC AB 2-0 CT1 TAPERPNT 27 (SUTURE) IMPLANT
SUTURE 2 FIBERLOOP 20 STRT BLU (SUTURE) IMPLANT
SUTURE FIBERWR #2 38 T-5 BLUE (SUTURE) IMPLANT
SYR CONTROL 10ML LL (SYRINGE) IMPLANT
SYR TB 1ML LL NO SAFETY (SYRINGE) IMPLANT
TAPE CLOTH SURG 6X10 WHT LF (GAUZE/BANDAGES/DRESSINGS) ×3 IMPLANT
TOWEL OR 17X26 10 PK STRL BLUE (TOWEL DISPOSABLE) ×3 IMPLANT
TUBE CONNECTING 12'X1/4 (SUCTIONS) ×2
TUBE CONNECTING 12X1/4 (SUCTIONS) ×4 IMPLANT
TUBING ARTHROSCOPY IRRIG 16FT (MISCELLANEOUS) ×3 IMPLANT
YANKAUER SUCT BULB TIP NO VENT (SUCTIONS) IMPLANT

## 2020-03-05 NOTE — Anesthesia Procedure Notes (Signed)
Procedure Name: Intubation Date/Time: 03/05/2020 12:19 PM Performed by: Bonney Aid, CRNA Pre-anesthesia Checklist: Patient identified, Emergency Drugs available, Suction available and Patient being monitored Patient Re-evaluated:Patient Re-evaluated prior to induction Oxygen Delivery Method: Circle system utilized Preoxygenation: Pre-oxygenation with 100% oxygen Induction Type: IV induction Ventilation: Mask ventilation without difficulty Laryngoscope Size: Mac and 3 Grade View: Grade I Tube type: Oral Tube size: 7.0 mm Number of attempts: 1 Airway Equipment and Method: Stylet and Oral airway Placement Confirmation: ETT inserted through vocal cords under direct vision,  positive ETCO2 and breath sounds checked- equal and bilateral Secured at: 21 cm Tube secured with: Tape Dental Injury: Teeth and Oropharynx as per pre-operative assessment

## 2020-03-05 NOTE — Anesthesia Preprocedure Evaluation (Signed)
Anesthesia Evaluation  °Patient identified by MRN, date of birth, ID band °Patient awake ° ° ° °Reviewed: °Allergy & Precautions, NPO status , Patient's Chart, lab work & pertinent test results ° °Airway °Mallampati: I ° °TM Distance: >3 FB °Neck ROM: Full ° ° ° Dental °  °Pulmonary ° °  °Pulmonary exam normal ° ° ° ° ° ° ° Cardiovascular °Normal cardiovascular exam ° ° °  °Neuro/Psych °  ° GI/Hepatic °  °Endo/Other  ° ° Renal/GU °  ° °  °Musculoskeletal ° ° Abdominal °  °Peds ° Hematology °  °Anesthesia Other Findings ° ° Reproductive/Obstetrics ° °  ° ° ° ° ° ° ° ° ° ° ° ° ° °  °  ° ° ° ° ° ° ° ° °Anesthesia Physical °Anesthesia Plan ° °ASA: II ° °Anesthesia Plan: General  ° °Post-op Pain Management:  Regional for Post-op pain  ° °Induction: Intravenous ° °PONV Risk Score and Plan: 2 and Ondansetron and Midazolam ° °Airway Management Planned: Oral ETT ° °Additional Equipment:  ° °Intra-op Plan:  ° °Post-operative Plan: Extubation in OR ° °Informed Consent: I have reviewed the patients History and Physical, chart, labs and discussed the procedure including the risks, benefits and alternatives for the proposed anesthesia with the patient or authorized representative who has indicated his/her understanding and acceptance.  ° ° ° ° ° °Plan Discussed with: CRNA and Surgeon ° °Anesthesia Plan Comments:   ° ° ° ° ° ° °Anesthesia Quick Evaluation ° °

## 2020-03-05 NOTE — Anesthesia Procedure Notes (Signed)
Anesthesia Regional Block: Interscalene brachial plexus block   Pre-Anesthetic Checklist: ,, timeout performed, Correct Patient, Correct Site, Correct Laterality, Correct Procedure, Correct Position, site marked, Risks and benefits discussed,  Surgical consent,  Pre-op evaluation,  At surgeon's request and post-op pain management  Laterality: Left  Prep: chloraprep       Needles:  Injection technique: Single-shot  Needle Type: Echogenic Stimulator Needle     Needle Length: 5cm  Needle Gauge: 21     Additional Needles:   Procedures:, nerve stimulator,,,,,,,   Nerve Stimulator or Paresthesia:  Response: 0.4 mA,   Additional Responses:   Narrative:  Start time: 03/05/2020 11:25 AM End time: 03/05/2020 11:35 AM Injection made incrementally with aspirations every 5 mL.  Performed by: Personally  Anesthesiologist: Arta Bruce, MD  Additional Notes: Monitors applied. Patient sedated. Sterile prep and drape,hand hygiene and sterile gloves were used. Relevant anatomy identified.Needle position confirmed.Local anesthetic injected incrementally after negative aspiration. Local anesthetic spread visualized around nerve(s). Vascular puncture avoided. No complications. Image printed for medical record.The patient tolerated the procedure well.

## 2020-03-05 NOTE — Op Note (Signed)
03/05/2020   PATIENT:  Evan Zimmerman    PRE-OPERATIVE DIAGNOSIS:  1. Left shoulder recurrent instability 2. Left shoulder anterior labrum tear with posterior extension 3.  Left shoulder engaging Hill-Sachs lesion.  POST-OPERATIVE DIAGNOSIS:  Same  PROCEDURE: 1.  Left shoulder arthroscopic anterior capsulorrhaphy (Bankart repair) 2.  Left shoulder arthroscopic Remplissage (posterior rotator cuff tenodesis).  SURGEON:  Nicholes Stairs, MD  PHYSICIAN ASSISTANT: None  ANESTHESIA:   General  ESTIMATED BLOOD LOSS: 20 cc  PREOPERATIVE INDICATIONS:  Evan Zimmerman is a  24 y.o. male with a diagnosis of Left shoulder instability with bipolar bone loss, but less than 13-1/2% on the glenoid side, who failed conservative measures and elected for surgical management.    The risks benefits and alternatives were discussed with the patient preoperatively including but not limited to the risks of infection, bleeding, nerve injury, cardiopulmonary complications, the need for revision surgery, among others, and the patient was willing to proceed.  OPERATIVE IMPLANTS:  Arthrex 1.8 mm fiber tack knotless sutures x6.  2 anchors for the posterior rotator cuff tenodesis and 4 anchors for the Bankart repair  OPERATIVE FINDINGS:  Rotator cuff tendons were intact x4.  There was evidence of multiple anterior instability episodes with a deep and wide posterior Hill-Sachs lesion that was engaging and off track.  There was minimal anterior bone loss less than 20% and probably closer to 10%.  There was anterior inferior labral tearing consistent with Bankart tear that did propagate posteriorly to about the 5 o'clock position in this left shoulder.  UNIQUE ASPECTS OF THE CASE:   Large engaging Hill-Sachs lesion.  Chronic appearing Bankart tear with some poor tissue quality the anterior aspect of the capsule.  OPERATIVE PROCEDURE: The patient was brought to the operating room and placed in the supine  position. General anesthesia was administered. IV antibiotics were given. General anesthesia was administered.   The upper extremity was examined and found to be grossly unstable particularly to anterior testing. The upper extremity was prepped and draped in the usual sterile fashion. The patient was in a semilateral decubitus position.  Time out was performed. Diagnostic arthroscopy was carried out the above-named findings.   I placed 2 anterior cannulas, one just off the superior boarder of the subscapularis and one in the superolateral aspect of the rotator interval utilizing spinal needle localization, and then mobilized the labrum off of the medial neck of the glenoid with the spatula.  I then prepared the neck of the glenoid with a shaver/rasp to optimize healing, while still preserving the anterior bone stock.  The labrum had excellent mobility.  However, the anterior capsular tissue was quite thin.  Next, we then established to posterior cannulas.  The initial cannula was dilated and placed over our primary viewing cannula.  While viewing from the anterior superior lateral cannula we did this work.  We then placed a cannula through the deltoid and just superficial to the infraspinatus tendon.  We then began the posterior rotator cuff tenodesis/repair.  This was accomplished by placing two 1.8 mm fiber tack anchors into the bed of the Hill-Sachs lesion.  The Hill-Sachs lesion had been previously prepared with the motorized shaver to decorticate any roughened bone.  These were placed approximately 1-1/2 cm apart on the edges of the Hill-Sachs lesion.  We then retrieve these through the percutaneously placed cannula into separate infraspinatus tenotomy's.  These sutures were not yet tensioned such that we could complete the Bankart repair.  We then turned  our attention to the Bankart repair.  The anterior Bankart tissue had previously been biologically prepared with spatula, motorized shaver, and rasp.   We then first placed a 5:00 anchor posteriorly to help tension the inferior posterior glenohumeral ligament.  This was accomplished utilizing a 1.8 mm Arthrex fiber tack anchor.  We used a suture shuttle through the capsule and labral tissue.  This created a nice posterior bumper and tension to the inferior posterior glenohumeral ligament.    We then moved to repair the anterior tissues.  While viewing from the posterior cannula and working through the 2 rotator interval cannulas we first placed a 7:00 anchor and used the tissue penetrator to graft the capsule and inferior glenohumeral ligament and labrum.  This was tensioned nicely at that position.  We then placed 2 more anchors at the 8:00 and 9 o'clock position.  These likewise had good tension.  We had reestablished the anterior bumper.  All suture tails were cut.   Lastly, we moved back to the posterior rotator cuff tenodesis.  The 2 previously passed and placed 1.8 mm fiber tack anchors were then passed amongst themselves to create a double pulley tensioning slide.  Care was taken to ensure no deltoid muscle was trapped beneath this by holding the cannula against the rotator cuff tendon.  We then watched the infraspinatus tissue pull into the Hill-Sachs lesion.  This had nice fixation into the defect posteriorly.  On dynamic examination this moved as a unit with the humeral head.  Excellent soft tissue restoration of tension was achieved, restoring the labrum bumper, and the humeral head was noted to be centered on the glenoid , and the arthroscopic cannulas were removed, and the portals closed with Monocryl followed by Steri-Strips and sterile gauze. The patient was awakened and returned to the PACU in stable and satisfactory condition. There were no complications and the patient tolerated the procedure well.  All counts were correct.  Disposition:  The patient will be nonweightbearing with an abduction sling to the operative extremity.  He may  begin scapular retractions and elbow hand and wrist range motion as tolerated.  He will begin physical therapy in 1 week.  I will see them back in the office in 2 weeks for a wound check.

## 2020-03-05 NOTE — Transfer of Care (Signed)
Immediate Anesthesia Transfer of Care Note  Patient: Evan Zimmerman  Procedure(s) Performed: Left shoulder Bankhart repair with Remplissage (Left Hip)  Patient Location: PACU  Anesthesia Type:General  Level of Consciousness: awake, alert  and oriented  Airway & Oxygen Therapy: Patient Spontanous Breathing and Patient connected to nasal cannula oxygen  Post-op Assessment: Report given to RN  Post vital signs: Reviewed and stable  Last Vitals: 137/85 Vitals Value Taken Time  BP    Temp    Pulse 91 03/05/20 1446  Resp 9 03/05/20 1446  SpO2 96 % 03/05/20 1446  Vitals shown include unvalidated device data.  Last Pain:  Vitals:   03/05/20 1113  TempSrc: Oral  PainSc: 0-No pain         Complications: No apparent anesthesia complications

## 2020-03-05 NOTE — H&P (Signed)
ORTHOPAEDIC H and P  REQUESTING PHYSICIAN: Evan Stairs, MD  PCP:  Patient, No Pcp Per  Chief Complaint: Left shoulder instability  HPI: Evan Zimmerman is a 24 y.o. male who complains of chronic left shoulder instability.  He has had multiple episodes and recurrence.  These have required close reduction in the emergency department.  He is here today for definitive arthroscopic stabilization.  Past Medical History:  Diagnosis Date  . Shoulder instability, left    hx multiple recurrent, last one 01-16-2020   Past Surgical History:  Procedure Laterality Date  . NO PAST SURGERIES     Social History   Socioeconomic History  . Marital status: Single    Spouse name: Not on file  . Number of children: Not on file  . Years of education: Not on file  . Highest education level: Not on file  Occupational History  . Not on file  Tobacco Use  . Smoking status: Never Smoker  . Smokeless tobacco: Never Used  Substance and Sexual Activity  . Alcohol use: Yes    Comment: occasional  . Drug use: Never  . Sexual activity: Not on file  Other Topics Concern  . Not on file  Social History Narrative  . Not on file   Social Determinants of Health   Financial Resource Strain:   . Difficulty of Paying Living Expenses:   Food Insecurity:   . Worried About Charity fundraiser in the Last Year:   . Arboriculturist in the Last Year:   Transportation Needs:   . Film/video editor (Medical):   Marland Kitchen Lack of Transportation (Non-Medical):   Physical Activity:   . Days of Exercise per Week:   . Minutes of Exercise per Session:   Stress:   . Feeling of Stress :   Social Connections:   . Frequency of Communication with Friends and Family:   . Frequency of Social Gatherings with Friends and Family:   . Attends Religious Services:   . Active Member of Clubs or Organizations:   . Attends Archivist Meetings:   Marland Kitchen Marital Status:    Family History  Problem Relation Age  of Onset  . Heart disease Mother   . Hyperlipidemia Mother    No Known Allergies Prior to Admission medications   Not on File   No results found.  Positive ROS: All other systems have been reviewed and were otherwise negative with the exception of those mentioned in the HPI and as above.  Physical Exam: General: Alert, no acute distress Cardiovascular: No pedal edema Respiratory: No cyanosis, no use of accessory musculature GI: No organomegaly, abdomen is soft and non-tender Skin: No lesions in the area of chief complaint Neurologic: Sensation intact distally Psychiatric: Patient is competent for consent with normal mood and affect Lymphatic: No axillary or cervical lymphadenopathy  MUSCULOSKELETAL:  Left upper extremity is warm and well-perfused and neurovascularly intact.  Assessment: Chronic left shoulder instability  Plan: -Evan Zimmerman and I have counseled in depth and at length about his left shoulder instability.  At this juncture he is indicated for stabilization procedure.  We discussed arthroscopic versus open techniques.  We discussed arthroscopic Bankart repair with possible Rempel size given the size of his Hill-Sachs lesion.  -We discussed the risk of bleeding, infection, failure of repairs, recurrent instability, persistent pain and stiffness as well as damage to surrounding neurovascular structures, development of arthrosis, and the need for further surgery.  He has provided  informed consent.  -We will DC postoperative    Evan Kida, MD Cell [phone]    03/05/2020 11:57 AM

## 2020-03-05 NOTE — Anesthesia Postprocedure Evaluation (Signed)
Anesthesia Post Note  Patient: Evan Zimmerman  Procedure(s) Performed: Left shoulder Bankhart repair with Remplissage (Left Hip)     Patient location during evaluation: PACU Anesthesia Type: General Level of consciousness: awake and alert Pain management: pain level controlled Vital Signs Assessment: post-procedure vital signs reviewed and stable Respiratory status: spontaneous breathing, nonlabored ventilation, respiratory function stable and patient connected to nasal cannula oxygen Cardiovascular status: blood pressure returned to baseline and stable Postop Assessment: no apparent nausea or vomiting Anesthetic complications: no    Last Vitals:  Vitals:   03/05/20 1135 03/05/20 1447  BP: 137/63 137/85  Pulse: 91 91  Resp: 10 (!) 9  Temp:  (!) 36.4 C  SpO2: 100% 96%    Last Pain:  Vitals:   03/05/20 1447  TempSrc:   PainSc: 7                  Fedora Knisely DAVID

## 2020-03-05 NOTE — Discharge Instructions (Signed)
-Maintain your arm in the sling with the abduction pillow at all times.  You may remove this when she begin showering but should place the arm back into the sling and pillow.  -You should use your ice machine on the shoulder as much as possible throughout the day.  This will reduce swelling and pain.  -Maintain postoperative bandages for 3 days.  You may remove them at that time and begin showering.  You should then cover your incisions with just Band-Aids to be changed on a daily basis.  Otherwise do not submerge underwater.  -No lifting with the left arm.  -For mild to moderate pain use Tylenol and Advil as well as the ice machine around-the-clock.  For breakthrough pain use oxycodone.  -Return to see Dr. Stann Mainland in 2 weeks for routine postop care.  Post Anesthesia Home Care Instructions  Activity: Get plenty of rest for the remainder of the day. A responsible individual must stay with you for 24 hours following the procedure.  For the next 24 hours, DO NOT: -Drive a car -Paediatric nurse -Drink alcoholic beverages -Take any medication unless instructed by your physician -Make any legal decisions or sign important papers.  Meals: Start with liquid foods such as gelatin or soup. Progress to regular foods as tolerated. Avoid greasy, spicy, heavy foods. If nausea and/or vomiting occur, drink only clear liquids until the nausea and/or vomiting subsides. Call your physician if vomiting continues.  Special Instructions/Symptoms: Your throat may feel dry or sore from the anesthesia or the breathing tube placed in your throat during surgery. If this causes discomfort, gargle with warm salt water. The discomfort should disappear within 24 hours.  Regional Anesthesia Blocks  1. Numbness or the inability to move the "blocked" extremity may last from 3-48 hours after placement. The length of time depends on the medication injected and your individual response to the medication. If the numbness  is not going away after 48 hours, call your surgeon.  2. The extremity that is blocked will need to be protected until the numbness is gone and the  Strength has returned. Because you cannot feel it, you will need to take extra care to avoid injury. Because it may be weak, you may have difficulty moving it or using it. You may not know what position it is in without looking at it while the block is in effect.  3. For blocks in the legs and feet, returning to weight bearing and walking needs to be done carefully. You will need to wait until the numbness is entirely gone and the strength has returned. You should be able to move your leg and foot normally before you try and bear weight or walk. You will need someone to be with you when you first try to ensure you do not fall and possibly risk injury.  4. Bruising and tenderness at the needle site are common side effects and will resolve in a few days.  5. Persistent numbness or new problems with movement should be communicated to the surgeon or the [place] [phone] [place] [phone]).Information for Discharge Teaching: EXPAREL (bupivacaine liposome injectable suspension)   Your surgeon or anesthesiologist gave you EXPAREL(bupivacaine) to help control your pain after surgery.   EXPAREL is a local anesthetic that provides pain relief by numbing the tissue around the surgical site.  EXPAREL is designed to release pain medication over time and can control pain for up to 72 hours.  Depending on how you  respond to EXPAREL, you may require less pain medication during your recovery.  Possible side effects:  Temporary loss of sensation or ability to move in the area where bupivacaine was injected.  Nausea, vomiting, constipation  Rarely, numbness and tingling in your mouth or lips, lightheadedness, or anxiety may occur.  Call your doctor right away if you think you may be experiencing any of these  sensations, or if you have other questions regarding possible side effects.  Follow all other discharge instructions given to you by your surgeon or nurse. Eat a healthy diet and drink plenty of water or other fluids.  If you return to the hospital for any reason within 96 hours following the administration of EXPAREL, it is important for health care providers to know that you have received this anesthetic. A teal colored band has been placed on your arm with the date, time and amount of EXPAREL you have received in order to alert and inform your health care providers. Please leave this armband in place for the full 96 hours following administration, and then you may remove the band.

## 2020-03-05 NOTE — Brief Op Note (Signed)
03/05/2020  2:49 PM  PATIENT:  Akili Hedden  24 y.o. male  PRE-OPERATIVE DIAGNOSIS:  Left shoulder instability  POST-OPERATIVE DIAGNOSIS:  Left shoulder instability  PROCEDURE:  Procedure(s) with comments: Left shoulder Bankhart repair with Remplissage (Left) - 2 hrs  SURGEON:  Surgeon(s) and Role:    * Aundria Rud, Noah Delaine, MD - Primary    ASSISTANTS: none   ANESTHESIA:   regional and general  EBL:  50 mL   BLOOD ADMINISTERED:none  DRAINS: none   LOCAL MEDICATIONS USED:  NONE  SPECIMEN:  No Specimen  DISPOSITION OF SPECIMEN:  N/A  COUNTS:  YES  TOURNIQUET:  * No tourniquets in log *  DICTATION: .Note written in EPIC  PLAN OF CARE: Discharge to home after PACU  PATIENT DISPOSITION:  PACU - hemodynamically stable.   Delay start of Pharmacological VTE agent (>24hrs) due to surgical blood loss or risk of bleeding: not applicable

## 2020-03-05 NOTE — Progress Notes (Signed)
AssistedDr. Ossey with left, ultrasound guided, interscalene  block. Side rails up, monitors on throughout procedure. See vital signs in flow sheet. Tolerated Procedure well.  

## 2020-03-07 NOTE — Addendum Note (Signed)
Addendum  created 03/07/20 1022 by Francie Massing, CRNA   Charge Capture section accepted

## 2021-01-10 IMAGING — DX DG SHOULDER 2+V*L*
2 series · 3 of 3 positions shown · non-contrast
Comparison: April 16, 2019

CLINICAL DATA: History of shoulder dislocations with apparent
dislocation after exaggerated motion

EXAM:
LEFT SHOULDER - 2+ VIEW

[shoulder ap]
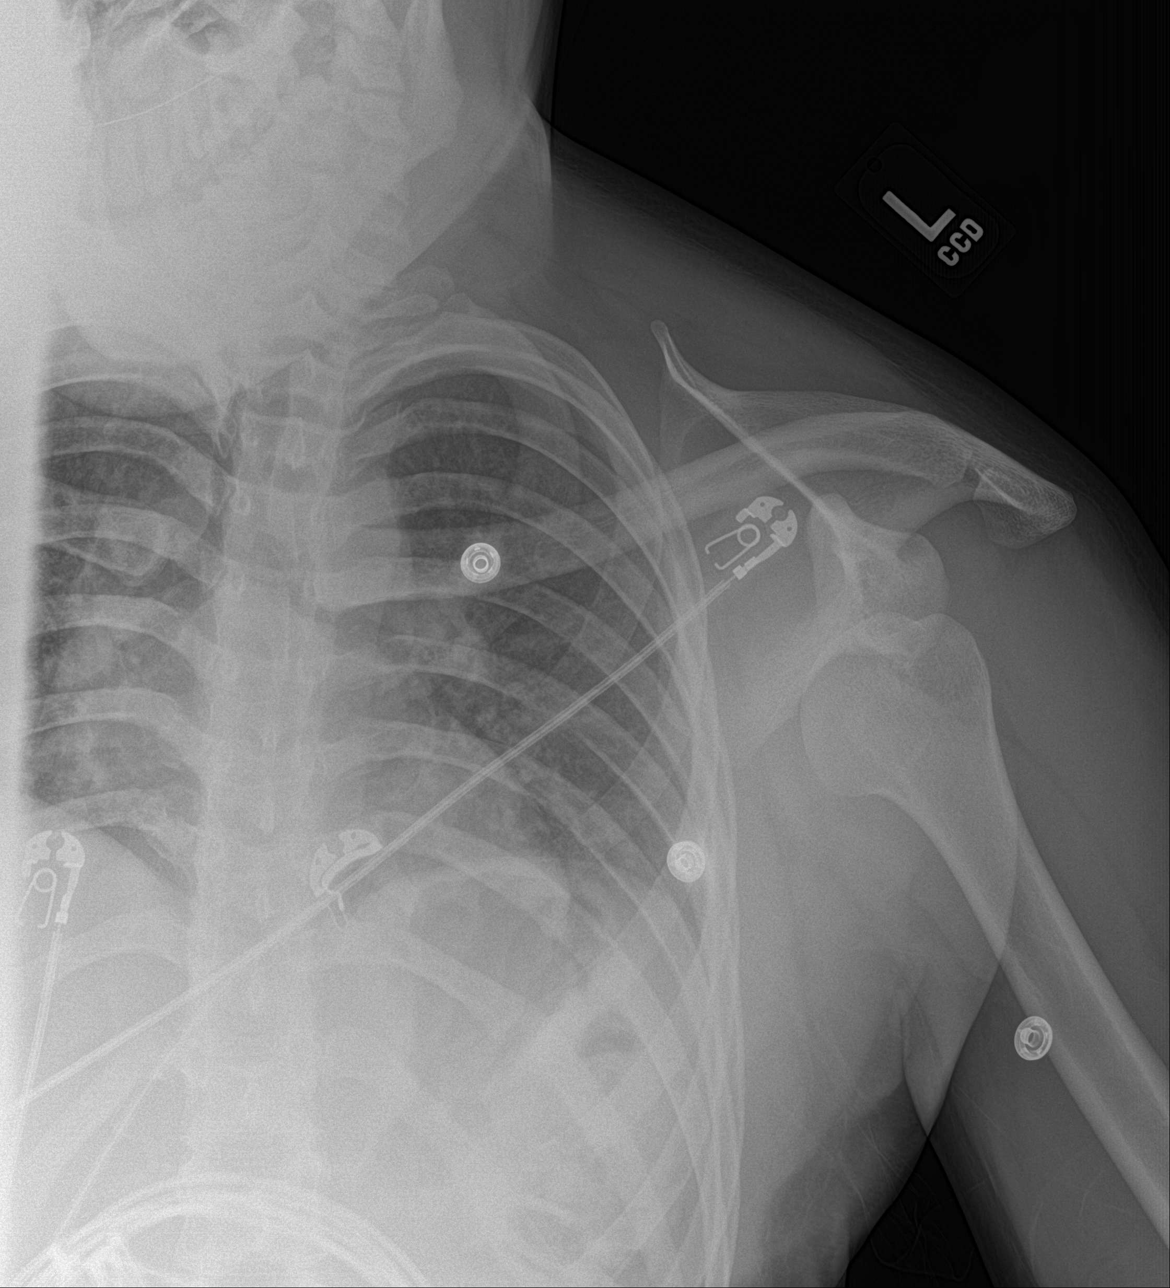

[Series 4: shoulder obl · 0.14mm/px · 2 of 2 slices shown]
[im 1/2]
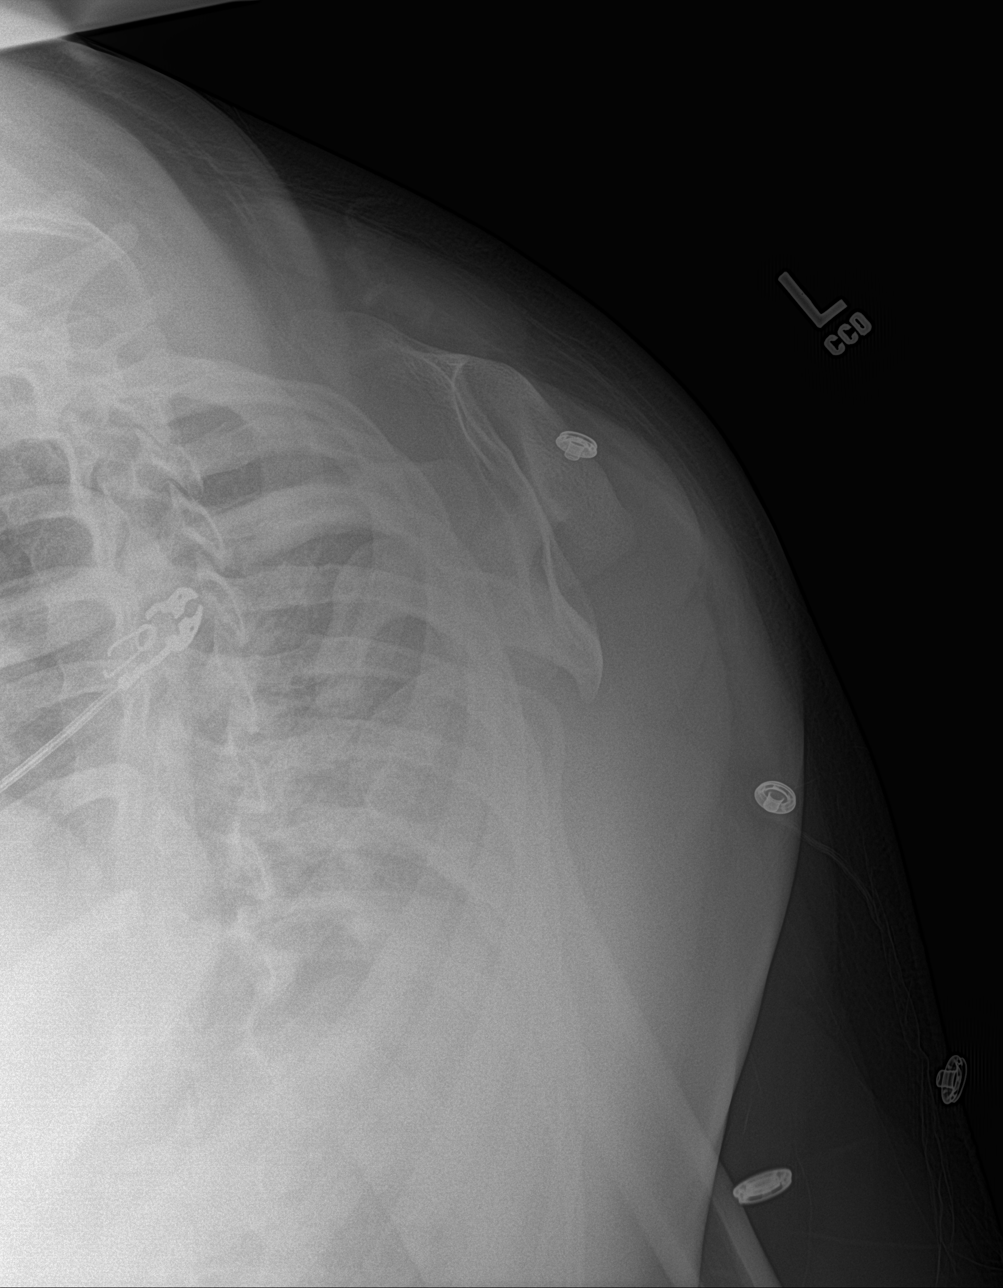
[im 2/2]
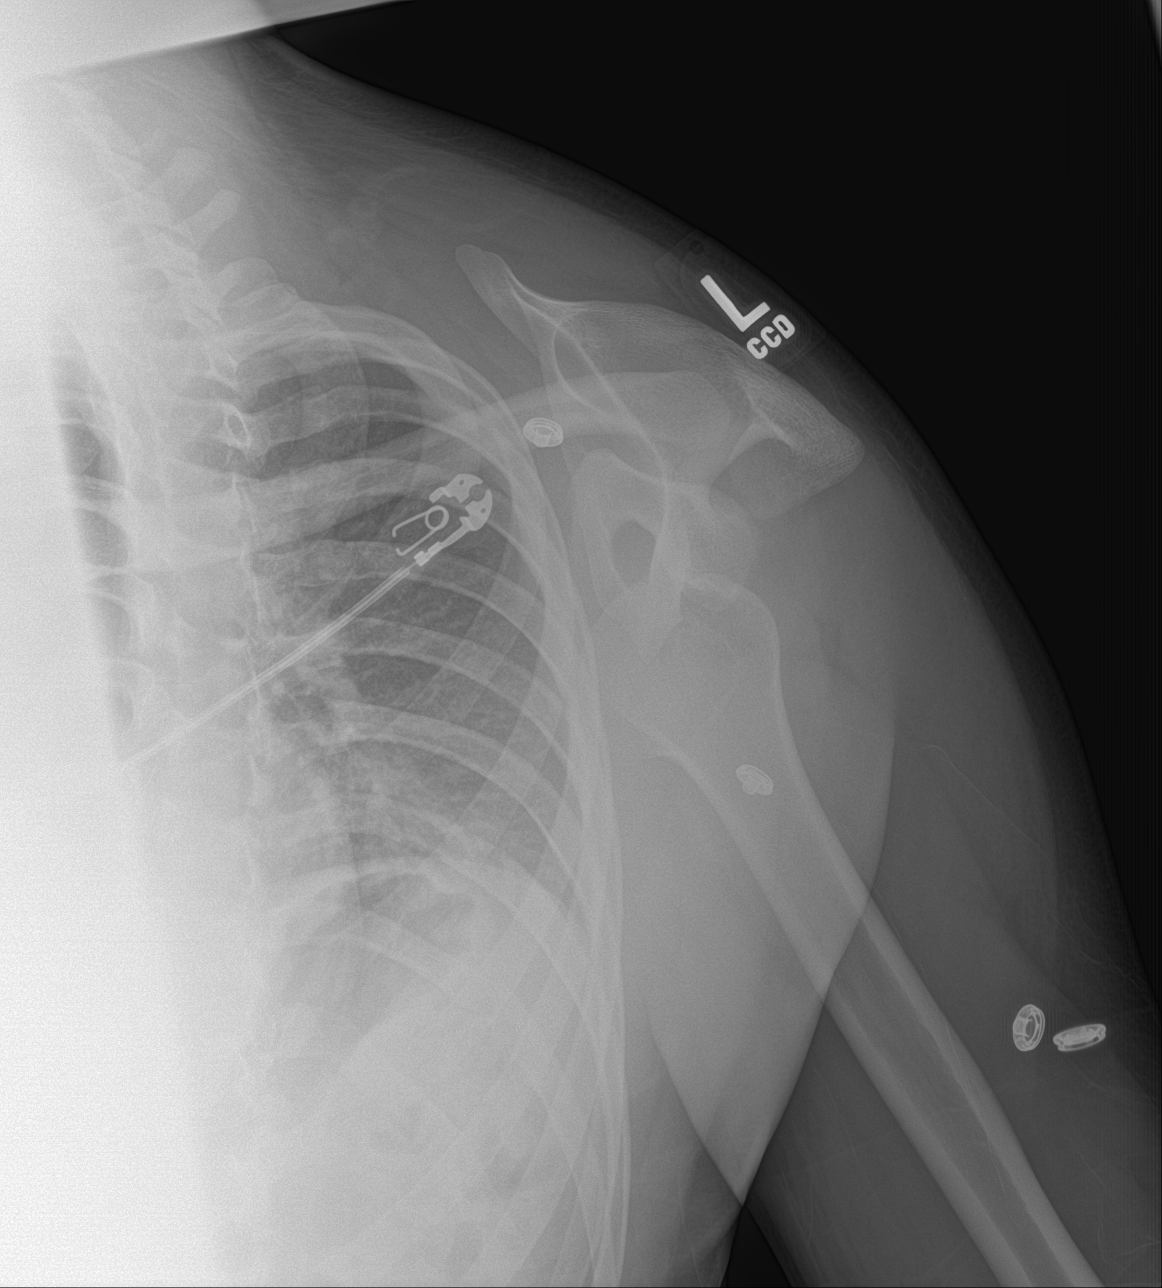

[3 of 3 positions shown; findings below may reference images not displayed]

FINDINGS: Frontal, oblique, and Y scapular images obtained. There is a
subcoracoid anterior dislocation. No evident acute fracture.
Apparent Hill-Sachs defect along the lateral humeral head. No
appreciable arthropathy. Left lung clear.
IMPRESSION: Subcoracoid anterior dislocation. Apparent Hill-Sachs defect along
the lateral humeral head. No acute fracture or evident arthropathy.

## 2021-04-13 ENCOUNTER — Other Ambulatory Visit: Payer: Self-pay

## 2021-04-13 ENCOUNTER — Encounter (HOSPITAL_COMMUNITY): Payer: Self-pay | Admitting: Emergency Medicine

## 2021-04-13 ENCOUNTER — Emergency Department (HOSPITAL_COMMUNITY)
Admission: EM | Admit: 2021-04-13 | Discharge: 2021-04-13 | Disposition: A | Discharge status: Home / Self Care | Payer: Managed Care, Other (non HMO) | Attending: Emergency Medicine | Admitting: Emergency Medicine

## 2021-04-13 DIAGNOSIS — R112 Nausea with vomiting, unspecified: Secondary | ICD-10-CM | POA: Diagnosis present

## 2021-04-13 LAB — COMPREHENSIVE METABOLIC PANEL
ALT: 47 U/L — ABNORMAL HIGH (ref 0–44)
AST: 29 U/L (ref 15–41)
Albumin: 4.6 g/dL (ref 3.5–5.0)
Alkaline Phosphatase: 77 U/L (ref 38–126)
Anion gap: 9 (ref 5–15)
BUN: 16 mg/dL (ref 6–20)
CO2: 24 mmol/L (ref 22–32)
Calcium: 9.5 mg/dL (ref 8.9–10.3)
Chloride: 104 mmol/L (ref 98–111)
Creatinine, Ser: 0.93 mg/dL (ref 0.61–1.24)
GFR, Estimated: >60 mL/min (ref >60)
Glucose, Bld: 120 mg/dL — ABNORMAL HIGH (ref 70–99)
Potassium: 3.6 mmol/L (ref 3.5–5.1)
Sodium: 137 mmol/L (ref 135–145)
Total Bilirubin: 0.8 mg/dL (ref 0.3–1.2)
Total Protein: 8.4 g/dL — ABNORMAL HIGH (ref 6.5–8.1)

## 2021-04-13 LAB — CBC WITH DIFFERENTIAL/PLATELET
Abs Immature Granulocytes: 0.05 10*3/uL (ref 0.00–0.07)
Basophils Absolute: 0 10*3/uL (ref 0.0–0.1)
Basophils Relative: 0 %
Eosinophils Absolute: 0 10*3/uL (ref 0.0–0.5)
Eosinophils Relative: 0 %
HCT: 46.5 % (ref 39.0–52.0)
Hemoglobin: 15.6 g/dL (ref 13.0–17.0)
Immature Granulocytes: 0 %
Lymphocytes Relative: 8 %
Lymphs Abs: 1 10*3/uL (ref 0.7–4.0)
MCH: 29.9 pg (ref 26.0–34.0)
MCHC: 33.5 g/dL (ref 30.0–36.0)
MCV: 89.3 fL (ref 80.0–100.0)
Monocytes Absolute: 0.5 10*3/uL (ref 0.1–1.0)
Monocytes Relative: 4 %
Neutro Abs: 12.1 10*3/uL — ABNORMAL HIGH (ref 1.7–7.7)
Neutrophils Relative %: 88 %
Platelets: 201 10*3/uL (ref 150–400)
RBC: 5.21 MIL/uL (ref 4.22–5.81)
RDW: 12.5 % (ref 11.5–15.5)
WBC: 13.8 10*3/uL — ABNORMAL HIGH (ref 4.0–10.5)
nRBC: 0 % (ref 0.0–0.2)

## 2021-04-13 LAB — LIPASE, BLOOD: Lipase: 30 U/L (ref 11–51)

## 2021-04-13 MED ORDER — ONDANSETRON HCL 4 MG/2ML IJ SOLN: 4.0000 mg | Freq: Once | INTRAMUSCULAR | Status: DC

## 2021-04-13 MED ORDER — ONDANSETRON 4 MG PO TBDP: 4.0000 mg | ORAL_TABLET | Freq: Three times a day (TID) | ORAL | 0 refills | Status: AC | PRN

## 2021-04-13 MED ORDER — ONDANSETRON 4 MG PO TBDP
4.0000 mg | ORAL_TABLET | Freq: Once | ORAL | Status: AC
  Administered 2021-04-13: 4 mg via ORAL
  Filled 2021-04-13: qty 1

## 2021-04-13 NOTE — ED Provider Notes (Signed)
[facility]-EMERGENCY DEPT Provider Note   CSN: [id number] Arrival date & time: 04/13/21  1047     History Chief Complaint  Patient presents with  . Emesis    Evan Zimmerman is a 25 y.o. male with noncontributory past medical history.  HPI Patient presents to emergency room today with chief complaint of emesis x1 day.  He endorses associated nausea.  Patient states yesterday he drank a bottle of tequila while he was hanging out with his friends.  He typically does not drink every day.  He states when he woke up this morning he had uncontrollable nausea and emesis.  He vomited more than 20 times in the last 24 hours.  He states emesis is nonbloody nonbilious.  He denies any associated abdominal pain.  He also admits to eating cookout yesterday.  He is the only one that got takeout so no known sick contacts.  No COVID exposures.  Patient denies any drug use.  No abdominal surgical history.    Past Medical History:  Diagnosis Date  . 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   Past Surgical History:  Procedure Laterality Date  . BANKART REPAIR Left 03/05/2020   Procedure: Left shoulder Bankhart repair with Remplissage;  Surgeon: Yolonda Kida, MD;  Location: [facility];  Service: Orthopedics;  Laterality: Left;  2 hrs  . NO PAST SURGERIES         Family History  Problem Relation Age of Onset  . Heart disease Mother   . Hyperlipidemia Mother     Social History   Tobacco Use  . Smoking status: Never Smoker  . Smokeless tobacco: Never Used  Vaping Use  . Vaping Use: Never used  Substance Use Topics  . Alcohol use: Yes    Comment: occasional  . Drug use: Never    Home Medications Prior to Admission medications   Medication Sig Start Date End Date Taking? Authorizing Provider  ondansetron (ZOFRAN ODT) 4 MG disintegrating tablet Take 1  tablet (4 mg total) by mouth every 8 (eight) hours as needed for nausea or vomiting. 04/13/21  Yes Shanon Ace, PA-C    Allergies    Patient has no known allergies.  Review of Systems   Review of Systems All other systems are reviewed and are negative for acute change except as noted in the HPI.  Physical Exam Updated Vital Signs BP (!) 154/88 (BP Location: Left Arm)   Pulse 70   Temp 98.3 F (36.8 C) (Oral)   Resp 16   Ht 5\' 5"  (1.651 m)   Wt 74.8 kg   SpO2 99%   BMI 27.46 kg/m   Physical Exam Vitals and nursing note reviewed.  Constitutional:      General: He is not in acute distress.    Appearance: He is not ill-appearing.  HENT:     Head: Normocephalic and atraumatic.     Right Ear: External ear normal.     Left Ear: External ear normal.     Nose: Nose normal.     Mouth/Throat:     Mouth: Mucous membranes are moist.     Pharynx: Oropharynx is clear.  Eyes:     General: No scleral icterus.       Right eye: No discharge.        Left eye: No discharge.     Extraocular Movements: Extraocular  movements intact.     Conjunctiva/sclera: Conjunctivae normal.     Pupils: Pupils are equal, round, and reactive to light.  Neck:     Vascular: No JVD.  Cardiovascular:     Rate and Rhythm: Normal rate and regular rhythm.     Pulses: Normal pulses.          Radial pulses are 2+ on the right side and 2+ on the left side.     Heart sounds: Normal heart sounds.  Pulmonary:     Comments: Lungs clear to auscultation in all fields. Symmetric chest rise. No wheezing, rales, or rhonchi. Abdominal:     Comments: Abdomen is soft, non-distended, and non-tender in all quadrants. No rigidity, no guarding. No peritoneal signs.  Musculoskeletal:        General: Normal range of motion.     Cervical back: Normal range of motion.  Skin:    General: Skin is warm and dry.     Capillary Refill: Capillary refill takes less than 2 seconds.  Neurological:     Mental Status: He is  oriented to person, place, and time.     GCS: GCS eye subscore is 4. GCS verbal subscore is 5. GCS motor subscore is 6.     Comments: Fluent speech, no facial droop.  Psychiatric:        Behavior: Behavior normal.     ED Results / Procedures / Treatments   Labs (all labs ordered are listed, but only abnormal results are displayed) Labs Reviewed  CBC WITH DIFFERENTIAL/PLATELET - Abnormal; Notable for the following components:      Result Value   WBC 13.8 (*)    Neutro Abs 12.1 (*)    All other components within normal limits  COMPREHENSIVE METABOLIC PANEL - Abnormal; Notable for the following components:   Glucose, Bld 120 (*)    Total Protein 8.4 (*)    ALT 47 (*)    All other components within normal limits  LIPASE, BLOOD  URINALYSIS, ROUTINE W REFLEX MICROSCOPIC    EKG None  Radiology No results found.  Procedures Procedures   Medications Ordered in ED Medications  ondansetron (ZOFRAN-ODT) disintegrating tablet 4 mg (4 mg Oral Given 04/13/21 1402)    ED Course  I have reviewed the triage vital signs and the nursing notes.  Pertinent labs & imaging results that were available during my care of the patient were reviewed by me and considered in my medical decision making (see chart for details).    MDM Rules/Calculators/A&P                           History provided by patient with additional history obtained from chart review.    Presenting with 1 day of emesis after heavy drinking.  Patient is not intoxicated on arrival.   He is well-appearing in no acute distress.  He has no abdominal tenderness, does not appear dehydrated. Given persistent vomiting labs were checked.  CBC with nonspecific leukocytosis of 13.8, no anemia.  CMP without significant electrolyte derangement, overall unremarkable liver function and normal anion gap.  No renal insufficiency.  Lipase within normal range.  Patient given p.o. Zofran and on reassessment is tolerating p.o. intake.  Serial  abdominal exams have been benign.  No indications for further emergent work-up at this time.  Patient discharged home with prescription for Zofran.  Strict return precautions discussed.  Patient discharged home in stable condition.  He is agreeable  with plan of care.   Portions of this note were generated with Scientist, clinical (histocompatibility and immunogenetics). Dictation errors may occur despite best attempts at proofreading.    Final Clinical Impression(s) / ED Diagnoses Final diagnoses:  Non-intractable vomiting with nausea, unspecified vomiting type    Rx / DC Orders ED Discharge Orders         Ordered    ondansetron (ZOFRAN ODT) 4 MG disintegrating tablet  Every 8 hours PRN        04/13/21 1423           Shanon Ace, PA-C 04/13/21 1431    Mancel Bale, MD 04/13/21 1801

## 2021-04-13 NOTE — ED Triage Notes (Signed)
Patient reports drinking alcohol yesterday morning, had a bottle of liquor and has been vomiting since then, unable to hold down food. Sates he thinks he drank too much, denies diarrhea or fevers.

## 2021-04-13 NOTE — ED Provider Notes (Signed)
Emergency Medicine Provider Triage Evaluation Note  Evan Zimmerman , a 25 y.o. male  was evaluated in triage.  Pt complains of emesis x1 day.  Patient mitts to drinking a bottle of tequila yesterday.  He is not a daily drinker.  He states when he woke up today he has had uncontrollable vomiting.  He thinks he has vomited more than 20 times in the last 24 hours.  He denies any blood in emesis.  No medication for symptoms prior to arrival.  Patient admits to eating cookout yesterday.  No sick contacts or known COVID exposures.  Denies any drug use   Review of Systems  Positive: Emesis, nausea Negative: Fever, chills, abdominal pain, urinary symptoms, diarrhea  Physical Exam  BP 125/86 (BP Location: Right Arm)   Pulse 65   Temp 98.3 F (36.8 C) (Oral)   Resp 16   Ht 5\' 5"  (1.651 m)   Wt 74.8 kg   SpO2 98%   BMI 27.46 kg/m  Gen:   Awake, no distress   Resp:  Normal effort  MSK:   Moves extremities without difficulty  Other:  No abdominal tenderness, no peritoneal signs.  Medical Decision Making  Medically screening exam initiated at 11:16 AM.  Appropriate orders placed.  Kaeden Pappalardo was informed that the remainder of the evaluation will be completed by another provider, this initial triage assessment does not replace that evaluation, and the importance of remaining in the ED until their evaluation is complete.  Abdominal labs as well as UA ordered.  Saline lock and Zofran ordered   Sherlon Handing 04/13/21 1121    04/15/21, MD 04/13/21 [phone]
